# Patient Record
Sex: Female | Born: 1935 | Race: White | Hispanic: No | Smoking: Never smoker
Health system: Southern US, Community
[De-identification: ages and names within clinical notes are randomized; demographics above are authoritative.]

## PROBLEM LIST (undated history)

## (undated) DIAGNOSIS — B019 Varicella without complication: Secondary | ICD-10-CM

## (undated) DIAGNOSIS — I10 Essential (primary) hypertension: Secondary | ICD-10-CM

## (undated) DIAGNOSIS — M810 Age-related osteoporosis without current pathological fracture: Secondary | ICD-10-CM

## (undated) DIAGNOSIS — H269 Unspecified cataract: Secondary | ICD-10-CM

## (undated) DIAGNOSIS — C189 Malignant neoplasm of colon, unspecified: Secondary | ICD-10-CM

## (undated) DIAGNOSIS — C801 Malignant (primary) neoplasm, unspecified: Secondary | ICD-10-CM

## (undated) DIAGNOSIS — D649 Anemia, unspecified: Secondary | ICD-10-CM

## (undated) HISTORY — DX: Varicella without complication: B01.9

## (undated) HISTORY — PX: EYE SURGERY: SHX253

## (undated) HISTORY — DX: Essential (primary) hypertension: I10

## (undated) HISTORY — DX: Unspecified cataract: H26.9

## (undated) HISTORY — PX: COLONOSCOPY: SHX174

## (undated) HISTORY — DX: Malignant neoplasm of colon, unspecified: C18.9

## (undated) HISTORY — DX: Age-related osteoporosis without current pathological fracture: M81.0

## (undated) HISTORY — DX: Anemia, unspecified: D64.9

---

## 2007-12-26 ENCOUNTER — Encounter: Admission: RE | Admit: 2007-12-26 | Discharge: 2007-12-26 | Payer: Self-pay | Admitting: Family Medicine

## 2021-03-27 ENCOUNTER — Encounter: Payer: Self-pay | Admitting: Internal Medicine

## 2021-03-27 ENCOUNTER — Encounter (INDEPENDENT_AMBULATORY_CARE_PROVIDER_SITE_OTHER): Payer: Self-pay

## 2021-03-27 ENCOUNTER — Ambulatory Visit (INDEPENDENT_AMBULATORY_CARE_PROVIDER_SITE_OTHER): Payer: Medicare Other | Admitting: Internal Medicine

## 2021-03-27 ENCOUNTER — Other Ambulatory Visit: Payer: Self-pay

## 2021-03-27 VITALS — BP 118/72 | HR 78 | Temp 98.2°F | Resp 18 | Ht 63.5 in | Wt 127.4 lb

## 2021-03-27 DIAGNOSIS — E2839 Other primary ovarian failure: Secondary | ICD-10-CM

## 2021-03-27 DIAGNOSIS — M8580 Other specified disorders of bone density and structure, unspecified site: Secondary | ICD-10-CM | POA: Diagnosis not present

## 2021-03-27 DIAGNOSIS — R7301 Impaired fasting glucose: Secondary | ICD-10-CM

## 2021-03-27 DIAGNOSIS — Z0000 Encounter for general adult medical examination without abnormal findings: Secondary | ICD-10-CM | POA: Insufficient documentation

## 2021-03-27 DIAGNOSIS — Z833 Family history of diabetes mellitus: Secondary | ICD-10-CM | POA: Diagnosis not present

## 2021-03-27 DIAGNOSIS — M810 Age-related osteoporosis without current pathological fracture: Secondary | ICD-10-CM | POA: Insufficient documentation

## 2021-03-27 DIAGNOSIS — Z13220 Encounter for screening for lipoid disorders: Secondary | ICD-10-CM

## 2021-03-27 LAB — COMPREHENSIVE METABOLIC PANEL
ALT: 8 U/L (ref 0–35)
AST: 14 U/L (ref 0–37)
Albumin: 4 g/dL (ref 3.5–5.2)
Alkaline Phosphatase: 77 U/L (ref 39–117)
BUN: 14 mg/dL (ref 6–23)
CO2: 24 mEq/L (ref 19–32)
Calcium: 9.2 mg/dL (ref 8.4–10.5)
Chloride: 104 mEq/L (ref 96–112)
Creatinine, Ser: 1.01 mg/dL (ref 0.40–1.20)
GFR: 51.45 mL/min — ABNORMAL LOW (ref >60.00)
Glucose, Bld: 95 mg/dL (ref 70–99)
Potassium: 3.7 mEq/L (ref 3.5–5.1)
Sodium: 136 mEq/L (ref 135–145)
Total Bilirubin: 0.4 mg/dL (ref 0.2–1.2)
Total Protein: 7.1 g/dL (ref 6.0–8.3)

## 2021-03-27 LAB — CBC
HCT: 31.8 % — ABNORMAL LOW (ref 36.0–46.0)
Hemoglobin: 10.1 g/dL — ABNORMAL LOW (ref 12.0–15.0)
MCHC: 31.8 g/dL (ref 30.0–36.0)
MCV: 73.2 fl — ABNORMAL LOW (ref 78.0–100.0)
Platelets: 298 10*3/uL (ref 150.0–400.0)
RBC: 4.34 Mil/uL (ref 3.87–5.11)
RDW: 16.8 % — ABNORMAL HIGH (ref 11.5–15.5)
WBC: 7.7 10*3/uL (ref 4.0–10.5)

## 2021-03-27 LAB — LIPID PANEL
Cholesterol: 182 mg/dL (ref 0–200)
HDL: 61.6 mg/dL (ref >39.00)
LDL Cholesterol: 99 mg/dL (ref 0–99)
NonHDL: 120.01
Total CHOL/HDL Ratio: 3
Triglycerides: 107 mg/dL (ref 0.0–149.0)
VLDL: 21.4 mg/dL (ref 0.0–40.0)

## 2021-03-27 LAB — HEMOGLOBIN A1C: Hgb A1c MFr Bld: 5.7 % (ref 4.6–6.5)

## 2021-03-27 NOTE — Assessment & Plan Note (Signed)
Checking HgA1c for screening. Weight normal.

## 2021-03-27 NOTE — Progress Notes (Signed)
Subjective:   Patient ID: Ann [name], female    DOB: March 13, 1935, 86 y.o.   MRN: [id number]  HPI Here for establish care and medicare wellness, no new complaints. Please see A/P for status and treatment of chronic medical problems.   HPI #2: Overall healthy, family history diabetes (mother and sister with diabetes later in life, denies excessive thirst or urination, weight gain or loss) and osteopenia (review DEXA 2009 with osteopenia, no follow up since, denies fractures or family history hip fracture, does not exercise currently, taking calcium daily).   Diet: heart healthy Physical activity: sedentary Depression/mood screen: negative Hearing: intact to whispered voice, mild loss bilaterally Visual acuity: grossly normal, performs annual eye exam  ADLs: capable Fall risk: none Home safety: good Cognitive evaluation: intact to orientation, naming, recall and repetition EOL planning: adv directives discussed  [place] Visit from 03/27/2021 in [place]  at [facility] Total Score 0      I have personally reviewed and have noted 1. The patient's medical and social history - reviewed today no changes 2. Their use of alcohol, tobacco or illicit drugs 3. Their current medications and supplements 4. The patient's functional ability including ADL's, fall risks, home safety risks and hearing or visual impairment. 5. Diet and physical activities 6. Evidence for depression or mood disorders 7. Care team reviewed and updated 8.  The patient is not on an opioid pain medication.  Patient Care Team: Hoyt Koch, MD as PCP - General (Internal Medicine) [facility] Past Medical History:  Diagnosis Date  . Chicken pox    History reviewed. No pertinent surgical history. Family History  Problem Relation Age of Onset  . Cancer Mother   . Diabetes Mother   . Early death Mother   . Arthritis Father   . Cancer Sister   . Heart  disease Brother   . Heart attack Brother   . Diabetes Sister   . Stroke Sister    Review of Systems  Constitutional: Negative.   HENT: Negative.   Eyes: Negative.   Respiratory: Negative for cough, chest tightness and shortness of breath.   Cardiovascular: Negative for chest pain, palpitations and leg swelling.  Gastrointestinal: Negative for abdominal distention, abdominal pain, constipation, diarrhea, nausea and vomiting.  Musculoskeletal: Negative.   Skin: Negative.   Neurological: Negative.   Psychiatric/Behavioral: Negative.      Objective:  Physical Exam Constitutional:      Appearance: She is well-developed.  HENT:     Head: Normocephalic and atraumatic.  Cardiovascular:     Rate and Rhythm: Normal rate and regular rhythm.  Pulmonary:     Effort: Pulmonary effort is normal. No respiratory distress.     Breath sounds: Normal breath sounds. No wheezing or rales.  Abdominal:     General: Bowel sounds are normal. There is no distension.     Palpations: Abdomen is soft.     Tenderness: There is no abdominal tenderness. There is no rebound.  Musculoskeletal:     Cervical back: Normal range of motion.  Skin:    General: Skin is warm and dry.  Neurological:     Mental Status: She is alert and oriented to person, place, and time.     Coordination: Coordination normal.    EKG: Rate 64, axis normal, interval normal, sinus, no st or t wave changes except inverted t on AVR likely not significant, no prior to compare   Vitals:   03/27/21 0854  BP: 118/72  Pulse: 78  Resp: 18  Temp: 98.2 F (36.8 C)  TempSrc: Oral  SpO2: 100%  Weight: 127 lb 6.4 oz (57.8 kg)  Height: 5' 3.5" (1.613 m)   This visit occurred during the SARS-CoV-2 public health emergency.  Safety protocols were in place, including screening questions prior to the visit, additional usage of staff PPE, and extensive cleaning of exam room while observing appropriate contact time as indicated for  disinfecting solutions.   Assessment & Plan:

## 2021-03-27 NOTE — Patient Instructions (Addendum)
Your EKG is normal today. We are checking the labs and will call you back about the results.   Health Maintenance, Female Adopting a healthy lifestyle and getting preventive care are important in promoting health and wellness. Ask your health care provider about:  The right schedule for you to have regular tests and exams.  Things you can do on your own to prevent diseases and keep yourself healthy. What should I know about diet, weight, and exercise? Eat a healthy diet  Eat a diet that includes plenty of vegetables, fruits, low-fat dairy products, and lean protein.  Do not eat a lot of foods that are high in solid fats, added sugars, or sodium.   Maintain a healthy weight Body mass index (BMI) is used to identify weight problems. It estimates body fat based on height and weight. Your health care provider can help determine your BMI and help you achieve or maintain a healthy weight. Get regular exercise Get regular exercise. This is one of the most important things you can do for your health. Most adults should:  Exercise for at least 150 minutes each week. The exercise should increase your heart rate and make you sweat (moderate-intensity exercise).  Do strengthening exercises at least twice a week. This is in addition to the moderate-intensity exercise.  Spend less time sitting. Even light physical activity can be beneficial. Watch cholesterol and blood lipids Have your blood tested for lipids and cholesterol at 84 years of age, then have this test every 5 years. Have your cholesterol levels checked more often if:  Your lipid or cholesterol levels are high.  You are older than 84 years of age.  You are at high risk for heart disease. What should I know about cancer screening? Depending on your health history and family history, you may need to have cancer screening at various ages. This may include screening for:  Breast cancer.  Cervical cancer.  Colorectal cancer.  Skin  cancer.  Lung cancer. What should I know about heart disease, diabetes, and high blood pressure? Blood pressure and heart disease  High blood pressure causes heart disease and increases the risk of stroke. This is more likely to develop in people who have high blood pressure readings, are of African descent, or are overweight.  Have your blood pressure checked: ? Every 3-5 years if you are 33-65 years of age. ? Every year if you are 20 years old or older. Diabetes Have regular diabetes screenings. This checks your fasting blood sugar level. Have the screening done:  Once every three years after age 10 if you are at a normal weight and have a low risk for diabetes.  More often and at a younger age if you are overweight or have a high risk for diabetes. What should I know about preventing infection? Hepatitis B If you have a higher risk for hepatitis B, you should be screened for this virus. Talk with your health care provider to find out if you are at risk for hepatitis B infection. Hepatitis C Testing is recommended for:  Everyone born from [birth year] through 1963.  Anyone with known risk factors for hepatitis C. Sexually transmitted infections (STIs)  Get screened for STIs, including gonorrhea and chlamydia, if: ? You are sexually active and are younger than 84 years of age. ? You are older than 84 years of age and your health care provider tells you that you are at risk for this type of infection. ? Your sexual activity has changed  since you were last screened, and you are at increased risk for chlamydia or gonorrhea. Ask your health care provider if you are at risk.  Ask your health care provider about whether you are at high risk for HIV. Your health care provider may recommend a prescription medicine to help prevent HIV infection. If you choose to take medicine to prevent HIV, you should first get tested for HIV. You should then be tested every 3 months for as long as you are taking  the medicine. Pregnancy  If you are about to stop having your period (premenopausal) and you may become pregnant, seek counseling before you get pregnant.  Take 400 to 800 micrograms (mcg) of folic acid every day if you become pregnant.  Ask for birth control (contraception) if you want to prevent pregnancy. Osteoporosis and menopause Osteoporosis is a disease in which the bones lose minerals and strength with aging. This can result in bone fractures. If you are 53 years old or older, or if you are at risk for osteoporosis and fractures, ask your health care provider if you should:  Be screened for bone loss.  Take a calcium or vitamin D supplement to lower your risk of fractures.  Be given hormone replacement therapy (HRT) to treat symptoms of menopause. Follow these instructions at home: Lifestyle  Do not use any products that contain nicotine or tobacco, such as cigarettes, e-cigarettes, and chewing tobacco. If you need help quitting, ask your health care provider.  Do not use street drugs.  Do not share needles.  Ask your health care provider for help if you need support or information about quitting drugs. Alcohol use  Do not drink alcohol if: ? Your health care provider tells you not to drink. ? You are pregnant, may be pregnant, or are planning to become pregnant.  If you drink alcohol: ? Limit how much you use to 0-1 drink a day. ? Limit intake if you are breastfeeding.  Be aware of how much alcohol is in your drink. In the [place] one drink equals one 12 oz bottle of beer (355 mL), one 5 oz glass of wine (148 mL), or one 1 oz glass of hard liquor (44 mL). General instructions  Schedule regular health, dental, and eye exams.  Stay current with your vaccines.  Tell your health care provider if: ? You often feel depressed. ? You have ever been abused or do not feel safe at home. Summary  Adopting a healthy lifestyle and getting preventive care are important in  promoting health and wellness.  Follow your health care provider's instructions about healthy diet, exercising, and getting tested or screened for diseases.  Follow your health care provider's instructions on monitoring your cholesterol and blood pressure. This information is not intended to replace advice given to you by your health care provider. Make sure you discuss any questions you have with your health care provider. Document Revised: 11/29/2018 Document Reviewed: 11/29/2018 Elsevier Patient Education  2021 [employer].

## 2021-03-27 NOTE — Assessment & Plan Note (Signed)
Ordered DEXA to assess for progression. Last 2009.

## 2021-03-27 NOTE — Assessment & Plan Note (Signed)
Flu shot counseled yearly. Pneumonia she thinks up to date. Shingrix counseled get at pharmacy. Tetanus she is unsure if up to date. Colonoscopy has never had, last FOBT 2009, aged out screening. Mammogram aged out last 2009 no prior problems, pap smear aged out and dexa last 2009 due ordered today. Counseled about sun safety and mole surveillance. Counseled about the dangers of distracted driving. Given 10 year screening recommendations.

## 2021-06-16 ENCOUNTER — Emergency Department (HOSPITAL_BASED_OUTPATIENT_CLINIC_OR_DEPARTMENT_OTHER): Payer: Medicare Other

## 2021-06-16 ENCOUNTER — Emergency Department (HOSPITAL_BASED_OUTPATIENT_CLINIC_OR_DEPARTMENT_OTHER)
Admission: EM | Admit: 2021-06-16 | Discharge: 2021-06-16 | Disposition: A | Discharge status: Home / Self Care | Payer: Medicare Other | Attending: Emergency Medicine | Admitting: Emergency Medicine

## 2021-06-16 ENCOUNTER — Encounter (HOSPITAL_BASED_OUTPATIENT_CLINIC_OR_DEPARTMENT_OTHER): Payer: Self-pay | Admitting: Obstetrics and Gynecology

## 2021-06-16 ENCOUNTER — Other Ambulatory Visit: Payer: Self-pay

## 2021-06-16 DIAGNOSIS — W01198A Fall on same level from slipping, tripping and stumbling with subsequent striking against other object, initial encounter: Secondary | ICD-10-CM | POA: Insufficient documentation

## 2021-06-16 DIAGNOSIS — R296 Repeated falls: Secondary | ICD-10-CM | POA: Diagnosis not present

## 2021-06-16 DIAGNOSIS — S01511A Laceration without foreign body of lip, initial encounter: Secondary | ICD-10-CM | POA: Insufficient documentation

## 2021-06-16 DIAGNOSIS — S0033XA Contusion of nose, initial encounter: Secondary | ICD-10-CM | POA: Insufficient documentation

## 2021-06-16 DIAGNOSIS — S00501A Unspecified superficial injury of lip, initial encounter: Secondary | ICD-10-CM | POA: Diagnosis present

## 2021-06-16 DIAGNOSIS — W19XXXA Unspecified fall, initial encounter: Secondary | ICD-10-CM

## 2021-06-16 MED ORDER — LIDOCAINE HCL (PF) 1 % IJ SOLN
5.0000 mL | Freq: Once | INTRAMUSCULAR | Status: AC
Start: 2021-06-16 — End: 2021-06-16
  Administered 2021-06-16: 5 mL
  Filled 2021-06-16: qty 5

## 2021-06-16 NOTE — ED Triage Notes (Signed)
Patient reports to the ER for a mechanical fall and lip laceration. Patient's lip is bleeding inside her mouth and notable laceration outside. Patient states her left nostril has also been bleeding. Patient denies taking a blood thinner and denies LOC.

## 2021-06-16 NOTE — ED Provider Notes (Signed)
[facility] EMERGENCY DEPT Provider Note   CSN: [id number] Arrival date & time: 06/16/21  1239     History Chief Complaint  Patient presents with   Fall   Lip Laceration    Ann [name] is a 86 y.o. female.  The history is provided by the patient.  Laceration Location:  Mouth Mouth laceration location:  Lower inner lip and lower outer lip Length:  1 cm Laceration depth: through and through. Quality: jagged   Bleeding: controlled   Time since incident:  1 hour Laceration mechanism:  Fall (tripped over a hose at the gas pump) Pain details:    Quality:  Throbbing   Severity:  Mild   Timing:  Constant   Progression:  Unchanged Foreign body present:  No foreign bodies Relieved by:  Pressure Worsened by:  Nothing Ineffective treatments:  None tried Associated symptoms: numbness (upper lip only, not extremities)   Associated symptoms: no fever, no focal weakness and no rash       Past Medical History:  Diagnosis Date   Chicken pox     Patient Active Problem List   Diagnosis Date Noted   Routine general medical examination at a health care facility 03/27/2021   Family history of diabetes mellitus 03/27/2021   Osteopenia 03/27/2021    History reviewed. No pertinent surgical history.   OB History     Gravida      Para      Term      Preterm      AB      Living  2      SAB      IAB      Ectopic      Multiple      Live Births              Family History  Problem Relation Age of Onset   Cancer Mother    Diabetes Mother    Early death Mother    Arthritis Father    Cancer Sister    Heart disease Brother    Heart attack Brother    Diabetes Sister    Stroke Sister     Social History   Tobacco Use   Smoking status: Never   Smokeless tobacco: Never  Vaping Use   Vaping Use: Never used  Substance Use Topics   Alcohol use: Yes   Drug use: Never    Home Medications Prior to Admission medications   Medication Sig  Start Date End Date Taking? Authorizing Provider  B Complex Vitamins (B COMPLEX 1 PO) Take 1 tablet by mouth daily.   Yes [provider]  Biotin w/ Vitamins C & E (HAIR/SKIN/NAILS PO) Take 1 tablet by mouth daily.   Yes [provider]  calcium carbonate (OSCAL) 1500 (600 Ca) MG TABS tablet Take 600 mg of elemental calcium by mouth daily with breakfast.   Yes [provider]  ferrous sulfate 325 (65 FE) MG tablet Take 325 mg by mouth daily with breakfast.   Yes [provider]  magnesium 30 MG tablet Take 30 mg by mouth 2 (two) times daily.   Yes [provider]    Allergies    Patient has no known allergies.  Review of Systems   Review of Systems  Constitutional:  Negative for chills and fever.  HENT:  Negative for ear pain and sore throat.   Eyes:  Negative for pain and visual disturbance.  Respiratory:  Negative for cough and shortness  of breath.   Cardiovascular:  Negative for chest pain and palpitations.  Gastrointestinal:  Negative for abdominal pain and vomiting.  Genitourinary:  Negative for dysuria and hematuria.  Musculoskeletal:  Negative for arthralgias and back pain.  Skin:  Negative for color change and rash.  Neurological:  Negative for focal weakness, seizures and syncope.  All other systems reviewed and are negative.  Physical Exam Updated Vital Signs BP (!) 157/91 (BP Location: Left Arm)   Pulse (!) 102   Temp 98.3 F (36.8 C) (Oral)   Resp 16   Ht 5' 3.5" (1.613 m)   Wt 57.6 kg   SpO2 98%   BMI 22.14 kg/m   Physical Exam Vitals and nursing note reviewed.  HENT:     Head: Normocephalic and atraumatic.     Nose: Nose normal.     Mouth/Throat:     Comments: There is a through and through laceration of the lower lip at its midpoint.  It is jagged and appears consistent with a bite.  No dental trauma Eyes:     General: No scleral icterus. Pulmonary:     Effort: Pulmonary effort is normal. No respiratory  distress.  Musculoskeletal:     Cervical back: Normal range of motion. No tenderness.  Skin:    General: Skin is warm and dry.  Neurological:     General: No focal deficit present.     Mental Status: She is alert. Mental status is at baseline.     Motor: No weakness.     Gait: Gait normal.  Psychiatric:        Mood and Affect: Mood normal.    ED Results / Procedures / Treatments   Labs (all labs ordered are listed, but only abnormal results are displayed) Labs Reviewed - No data to display  EKG None  Radiology CT Head Wo Contrast  Result Date: 06/16/2021 CLINICAL DATA:  Recent fall EXAM: CT HEAD WITHOUT CONTRAST TECHNIQUE: Contiguous axial images were obtained from the base of the skull through the vertex without intravenous contrast. COMPARISON:  None. FINDINGS: Brain: No evidence of acute infarction, hemorrhage, hydrocephalus, extra-axial collection or mass lesion/mass effect. Chronic atrophic and ischemic changes are noted. Vascular: No hyperdense vessel or unexpected calcification. Skull: Normal. Negative for fracture or focal lesion. Sinuses/Orbits: No acute finding. Other: Some air is noted along the nasal bridge likely related to the recent injury. The nasal bones are incompletely evaluated on this exam. IMPRESSION: Chronic atrophic and ischemic changes. No acute intracranial abnormality noted. Subcutaneous air along the nasal bridge which may be related to the recent injury. The need for further evaluation can be determined on a clinical basis. Electronically Signed   By: Inez Catalina M.D.   On: 06/16/2021 14:47    Procedures .Marland KitchenLaceration Repair  Date/Time: 06/16/2021 3:50 PM Performed by: Arnaldo Natal, MD Authorized by: Arnaldo Natal, MD   Consent:    Consent obtained:  Verbal   Consent given by:  Patient   Risks, benefits, and alternatives were discussed: yes     Risks discussed:  Infection, pain, retained foreign body, need for additional repair, poor cosmetic  result, poor wound healing and vascular damage   Alternatives discussed:  No treatment, delayed treatment, observation and referral Universal protocol:    Immediately prior to procedure, a time out was called: yes     Patient identity confirmed:  Verbally with patient Anesthesia:    Anesthesia method:  Local infiltration   Local anesthetic:  Lidocaine 1%  w/o epi Laceration details:    Location:  Lip   Lip location:  Lower interior lip   Length (cm):  1   Depth (mm):  1 Pre-procedure details:    Preparation:  Patient was prepped and draped in usual sterile fashion Exploration:    Hemostasis achieved with:  Direct pressure   Wound exploration: wound explored through full range of motion and entire depth of wound visualized     Wound extent: no fascia violation noted, no foreign bodies/material noted, no muscle damage noted, no nerve damage noted and no vascular damage noted     Contaminated: no   Treatment:    Area cleansed with:  Saline   Amount of cleaning:  Extensive   Irrigation solution:  Sterile saline   Irrigation volume:  10   Irrigation method:  Syringe   Visualized foreign bodies/material removed: no     Debridement:  None Skin repair:    Repair method:  Sutures   Suture size:  4-0   Suture material:  Fast-absorbing gut   Suture technique:  Horizontal mattress   Number of sutures:  2 Approximation:    Approximation:  Close   Vermilion border well-aligned: yes   Repair type:    Repair type:  Simple Post-procedure details:    Dressing:  Open (no dressing)   Procedure completion:  Tolerated well, no immediate complications   Medications Ordered in ED Medications  lidocaine (PF) (XYLOCAINE) 1 % injection 5 mL (has no administration in time range)    ED Course  I have reviewed the triage vital signs and the nursing notes.  Pertinent labs & imaging results that were available during my care of the patient were reviewed by me and considered in my medical decision  making (see chart for details).  Clinical Course as of 06/16/21 1549  Tue Jun 16, 2021  1548 Patient declined tetanus injection.  She states that it is not up to date. [AW]    Clinical Course User Index [AW] Arnaldo Natal, MD   MDM Rules/Calculators/A&P                          [name] presents with fall and a lip laceration. CT performed and was negative for head bleed or other trauma. Laceration was through and through.  I placed absorbable sutures with buried knots in the internal laceration to avoid food contamination.  The external laceration was more stellate and jagged.  It was macerated and would not of come together well with a suture.  It did not cross the vermilion border. Final Clinical Impression(s) / ED Diagnoses Final diagnoses:  Fall, initial encounter  Lip laceration, initial encounter  Contusion of nose, initial encounter    Rx / DC Orders ED Discharge Orders     None        Arnaldo Natal, MD 06/16/21 1553

## 2021-06-16 NOTE — Discharge Instructions (Addendum)
Please rinse your mouth twice daily with a one-to-one mixture of water and hydrogen peroxide.  Apply ice to the bridge of your nose as well as your lip.  Stitches should dissolve.

## 2021-07-07 ENCOUNTER — Ambulatory Visit (INDEPENDENT_AMBULATORY_CARE_PROVIDER_SITE_OTHER): Payer: Medicare Other | Admitting: Ophthalmology

## 2021-07-07 ENCOUNTER — Other Ambulatory Visit: Payer: Self-pay

## 2021-07-07 DIAGNOSIS — H35341 Macular cyst, hole, or pseudohole, right eye: Secondary | ICD-10-CM | POA: Insufficient documentation

## 2021-07-07 DIAGNOSIS — Z9889 Other specified postprocedural states: Secondary | ICD-10-CM | POA: Insufficient documentation

## 2021-07-07 NOTE — Assessment & Plan Note (Signed)
OS looks great excellent acuity recovery

## 2021-07-07 NOTE — Assessment & Plan Note (Addendum)
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  Patient will need repair via vitrectomy, membrane peel and injection of intravitreal gas.  I will explained to the patient that we no longer do extensive facedown positioning but only look downwards as of a book is reading in a lap for the 3 days postoperatively  Patient will be restricted not to sleep on her back for 2 weeks after surgery.  We will schedule vitrectomy membrane peel to fit her schedule sometime within the next 2 months  The nature of macular hole was discussed with the patient as well as possibility of second eye involvement at a later date.  The risks of no treatment were reviewed as well, as the possibility of surgical repair and the risks with that modality.  The requirement for postoperative face downward in a reading position was discussed.  The usual period of 3-5 days, positioning while awake, was disclosed.  The patient must not sleep on back.  The patient may sleep on either side after surgery  for approximately  2 weeks.  The alternative surgical repair with vitrectomy and using silicone oil was discussed.   Typical results with oil for this use to repair macular hole are not as good as gas utilization.   With oil use, the need for a second surgery reviewed.   If the patient's operative eye is [place], cataract surgery is very often recommended by referring to cataract surgeon, prior to Vitrectomy. This will allow a clear view of the macular hole  for the repair,  and best visual acuity results ultimately for the patient.  All the patient's questions were answered.  An informational note was given.

## 2021-07-07 NOTE — Progress Notes (Signed)
07/07/2021     CHIEF COMPLAINT Patient presents for Macular Hole   HISTORY OF PRESENT ILLNESS: Ann [name] is a 86 y.o. female who presents to the clinic today for:     Referring physician: [facility] [address] [place],  [place] [address]  HISTORICAL INFORMATION:   Selected notes from the MEDICAL RECORD NUMBER    Lab Results  Component Value Date   HGBA1C 5.7 03/27/2021     CURRENT MEDICATIONS: No current outpatient medications on file. (Ophthalmic Drugs)   No current facility-administered medications for this visit. (Ophthalmic Drugs)   Current Outpatient Medications (Other)  Medication Sig   B Complex Vitamins (B COMPLEX 1 PO) Take 1 tablet by mouth daily.   Biotin w/ Vitamins C & E (HAIR/SKIN/NAILS PO) Take 1 tablet by mouth daily.   calcium carbonate (OSCAL) 1500 (600 Ca) MG TABS tablet Take 600 mg of elemental calcium by mouth daily with breakfast.   ferrous sulfate 325 (65 FE) MG tablet Take 325 mg by mouth daily with breakfast.   magnesium 30 MG tablet Take 30 mg by mouth 2 (two) times daily.   No current facility-administered medications for this visit. (Other)      REVIEW OF SYSTEMS:    ALLERGIES No Known Allergies  PAST MEDICAL HISTORY Past Medical History:  Diagnosis Date   Chicken pox    No past surgical history on file.  FAMILY HISTORY Family History  Problem Relation Age of Onset   Cancer Mother    Diabetes Mother    Early death Mother    Arthritis Father    Cancer Sister    Heart disease Brother    Heart attack Brother    Diabetes Sister    Stroke Sister     SOCIAL HISTORY Social History   Tobacco Use   Smoking status: Never   Smokeless tobacco: Never  Vaping Use   Vaping Use: Never used  Substance Use Topics   Alcohol use: Yes   Drug use: Never         OPHTHALMIC EXAM:  Base Eye Exam     Visual Acuity (ETDRS)       Right Left   Dist cc 20/60 -1 20/30 +2    Correction: Glasses          Tonometry     Tonopen, 2:00 PM         Pupils       Pupils APD   Right PERRL None   Left PERRL None         Visual Fields       Left Right    Full Full         Extraocular Movement       Right Left    Full, Ortho Full, Ortho         Neuro/Psych     Oriented x3: Yes   Mood/Affect: Normal         Dilation     Both eyes: 1.0% Mydriacyl, 2.5% Phenylephrine @ 2:00 PM           Slit Lamp and Fundus Exam     External Exam       Right Left   External Normal Normal         Slit Lamp Exam       Right Left   Lids/Lashes Normal Normal   Conjunctiva/Sclera White and quiet White and quiet   Cornea Clear Clear  Anterior Chamber Deep and quiet Deep and quiet   Iris Round and reactive Round and reactive   Lens Centered posterior chamber intraocular lens Centered posterior chamber intraocular lens   Anterior Vitreous Normal Clear avitric         Fundus Exam       Right Left   Posterior Vitreous Normal Clear avitric   Disc Normal Normal   C/D Ratio 0.25 0.25   Macula Normal Normal   Vessels Normal Normal   Periphery Normal, no holes or tears Normal            IMAGING AND PROCEDURES  Imaging and Procedures for 07/07/21  OCT, Retina - OU - Both Eyes       Right Eye Quality was good. Scan locations included subfoveal. Central Foveal Thickness: 315. Progression has worsened. Findings include macular hole, cystoid macular edema.   Left Eye Quality was good. Scan locations included subfoveal. Central Foveal Thickness: 312. Progression has been stable.   Notes OS with a history of macular hole with VMT, repair 2016.  Now with intact outer retina.  Stable  OD, with macular hole approximately 250 m in size with adjacent perifoveal CME             ASSESSMENT/PLAN:  History of vitrectomy OS looks great excellent acuity recovery  Macular hole of right eye New vision loss right eye associated with macular hole.   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  Patient will need repair via vitrectomy, membrane peel and injection of intravitreal gas.  I will explained to the patient that we no longer do extensive facedown positioning but only look downwards as of a book is reading in a lap for the 3 days postoperatively  Patient will be restricted not to sleep on her back for 2 weeks after surgery.  We will schedule vitrectomy membrane peel to fit her schedule sometime within the next 2 months  The nature of macular hole was discussed with the patient as well as possibility of second eye involvement at a later date.  The risks of no treatment were reviewed as well, as the possibility of surgical repair and the risks with that modality.  The requirement for postoperative face downward in a reading position was discussed.  The usual period of 3-5 days, positioning while awake, was disclosed.  The patient must not sleep on back.  The patient may sleep on either side after surgery  for approximately  2 weeks.  The alternative surgical repair with vitrectomy and using silicone oil was discussed.   Typical results with oil for this use to repair macular hole are not as good as gas utilization.   With oil use, the need for a second surgery reviewed.   If the patient's operative eye is [place], cataract surgery is very often recommended by referring to cataract surgeon, prior to Vitrectomy. This will allow a clear view of the macular hole  for the repair,  and best visual acuity results ultimately for the patient.  All the patient's questions were answered.  An informational note was given.     ICD-10-CM   1. Macular hole of right eye  H35.341 OCT, Retina - OU - Both Eyes    2. History of vitrectomy  Z98.890       1.  OD, with new onset macular hole with vision loss.  Will need and require surgical intervention via vitrectomy membrane peel, injection intravitreal gas and face looking downwards gently for 3  days while  awake postoperatively.  2.  Patient understands she is not to sleep or rest on her back for 2 weeks post surgery.  3.  Surgical date will be provided to the patient today  4.  Patient return for preoperative paperwork and discussion regarding the use of topical medications preoperatively  Ophthalmic Meds Ordered this visit:  No orders of the defined types were placed in this encounter.      Return ,, [facility], for Schedule vitrectomy membrane [phone] gas injection, OD.  There are no Patient Instructions on file for this visit.   Explained the diagnoses, plan, and follow up with the patient and they expressed understanding.  Patient expressed understanding of the importance of proper follow up care.   Clent Demark Kimberla Driskill M.D. Diseases & Surgery of the Retina and Vitreous Retina & Diabetic [place] 07/07/21     Abbreviations: M myopia (nearsighted); A astigmatism; H hyperopia (farsighted); P presbyopia; Mrx spectacle prescription;  CTL contact lenses; OD right eye; OS left eye; OU both eyes  XT exotropia; ET esotropia; PEK punctate epithelial keratitis; PEE punctate epithelial erosions; DES dry eye syndrome; MGD meibomian gland dysfunction; ATs artificial tears; PFAT's preservative free artificial tears; [place] nuclear sclerotic cataract; PSC posterior subcapsular cataract; ERM epi-retinal membrane; PVD posterior vitreous detachment; RD retinal detachment; DM diabetes mellitus; DR diabetic retinopathy; NPDR non-proliferative diabetic retinopathy; PDR proliferative diabetic retinopathy; CSME clinically significant macular edema; DME diabetic macular edema; dbh dot blot hemorrhages; CWS cotton wool spot; POAG primary open angle glaucoma; C/D cup-to-disc ratio; HVF humphrey visual field; GVF goldmann visual field; OCT optical coherence tomography; IOP intraocular pressure; BRVO Branch retinal vein occlusion; CRVO central retinal vein occlusion; CRAO central retinal artery  occlusion; BRAO branch retinal artery occlusion; RT retinal tear; SB scleral buckle; PPV pars plana vitrectomy; VH Vitreous hemorrhage; PRP panretinal laser photocoagulation; IVK intravitreal kenalog; VMT vitreomacular traction; MH Macular hole;  NVD neovascularization of the disc; NVE neovascularization elsewhere; AREDS age related eye disease study; ARMD age related macular degeneration; POAG primary open angle glaucoma; EBMD epithelial/anterior basement membrane dystrophy; ACIOL anterior chamber intraocular lens; IOL intraocular lens; PCIOL posterior chamber intraocular lens; Phaco/IOL phacoemulsification with intraocular lens placement; [place] photorefractive keratectomy; LASIK laser assisted in situ keratomileusis; HTN hypertension; DM diabetes mellitus; COPD chronic obstructive pulmonary disease

## 2021-07-13 ENCOUNTER — Ambulatory Visit (INDEPENDENT_AMBULATORY_CARE_PROVIDER_SITE_OTHER): Payer: Medicare Other | Admitting: Ophthalmology

## 2021-07-13 ENCOUNTER — Other Ambulatory Visit: Payer: Self-pay

## 2021-07-13 ENCOUNTER — Encounter (INDEPENDENT_AMBULATORY_CARE_PROVIDER_SITE_OTHER): Payer: Self-pay | Admitting: Ophthalmology

## 2021-07-13 DIAGNOSIS — H35341 Macular cyst, hole, or pseudohole, right eye: Secondary | ICD-10-CM

## 2021-07-13 MED ORDER — OFLOXACIN 0.3 % OP SOLN: 1.0000 [drp] | Freq: Four times a day (QID) | OPHTHALMIC | 0 refills | Status: AC

## 2021-07-13 MED ORDER — PREDNISOLONE ACETATE 1 % OP SUSP: 1.0000 [drp] | Freq: Four times a day (QID) | OPHTHALMIC | 0 refills | Status: AC

## 2021-07-13 NOTE — Progress Notes (Signed)
07/13/2021     CHIEF COMPLAINT Patient presents for Pre-op Exam (Pt here for pre-op. Pt will have PPV/Membrane Peel and Gas Injection OD on 07/22/2021/)   HISTORY OF PRESENT ILLNESS: Ann [name] is a 86 y.o. female who presents to the clinic today for:   HPI     Pre-op Exam           Comments: Pt here for pre-op. Pt will have PPV/Membrane Peel and Gas Injection OD on 07/22/2021        Last edited by Kendra Opitz, COA on 07/13/2021 10:43 AM.        HISTORICAL INFORMATION:   Selected notes from the MEDICAL RECORD NUMBER    Lab Results  Component Value Date   HGBA1C 5.7 03/27/2021     CURRENT MEDICATIONS: Current Outpatient Medications (Ophthalmic Drugs)  Medication Sig   ofloxacin (OCUFLOX) 0.3 % ophthalmic solution Place 1 drop into the right eye in the morning, at noon, in the evening, and at bedtime for 21 doses.   prednisoLONE acetate (PRED FORTE) 1 % ophthalmic suspension Place 1 drop into the right eye 4 (four) times daily for 21 doses.   No current facility-administered medications for this visit. (Ophthalmic Drugs)   Current Outpatient Medications (Other)  Medication Sig   B Complex Vitamins (B COMPLEX 1 PO) Take 1 tablet by mouth daily.   Biotin w/ Vitamins C & E (HAIR/SKIN/NAILS PO) Take 1 tablet by mouth daily.   calcium carbonate (OSCAL) 1500 (600 Ca) MG TABS tablet Take 600 mg of elemental calcium by mouth daily with breakfast.   ferrous sulfate 325 (65 FE) MG tablet Take 325 mg by mouth daily with breakfast.   magnesium 30 MG tablet Take 30 mg by mouth 2 (two) times daily.   No current facility-administered medications for this visit. (Other)     ALLERGIES No Known Allergies  PAST MEDICAL HISTORY Past Medical History:  Diagnosis Date   Chicken pox    History reviewed. No pertinent surgical history.  FAMILY HISTORY Family History  Problem Relation Age of Onset   Cancer Mother    Diabetes Mother    Early death Mother    Arthritis  Father    Cancer Sister    Heart disease Brother    Heart attack Brother    Diabetes Sister    Stroke Sister     SOCIAL HISTORY Social History   Tobacco Use   Smoking status: Never   Smokeless tobacco: Never  Vaping Use   Vaping Use: Never used  Substance Use Topics   Alcohol use: Yes   Drug use: Never         OPHTHALMIC EXAM:  Base Eye Exam     Visual Acuity (ETDRS)       Right Left   Dist cc 20/60 -1 20/25 -1   Dist ph cc NI          Tonometry (Tonopen, 10:49 AM)       Right Left   Pressure 12 12         Pupils       Pupils Dark Light Shape React APD   Right PERRL 3 2 Round Brisk None   Left PERRL 3 2 Round Brisk None         Extraocular Movement       Right Left    Full Full         Neuro/Psych     Oriented x3: Yes  Mood/Affect: Normal         Dilation     Both eyes: No Dilation @ 10:49 AM           Slit Lamp and Fundus Exam     External Exam       Right Left   External Normal Normal         Slit Lamp Exam       Right Left   Lids/Lashes Normal Normal   Conjunctiva/Sclera White and quiet White and quiet   Cornea Clear Clear   Anterior Chamber Deep and quiet Deep and quiet   Iris Round and reactive Round and reactive   Lens Centered posterior chamber intraocular lens Centered posterior chamber intraocular lens   Anterior Vitreous Normal Clear avitric         Fundus Exam       Right Left   Posterior Vitreous Normal Clear avitric   Disc Normal Normal   C/D Ratio 0.25 0.25   Macula Normal Normal   Vessels Normal Normal   Periphery Normal, no holes or tears Normal            IMAGING AND PROCEDURES  Imaging and Procedures for '@TODAY'$ @           ASSESSMENT/PLAN:  No diagnosis found.  Ophthalmic Meds Ordered this visit:  Meds ordered this encounter  Medications   ofloxacin (OCUFLOX) 0.3 % ophthalmic solution    Sig: Place 1 drop into the right eye in the morning, at noon, in the evening,  and at bedtime for 21 doses.    Dispense:  5 mL    Refill:  0   prednisoLONE acetate (PRED FORTE) 1 % ophthalmic suspension    Sig: Place 1 drop into the right eye 4 (four) times daily for 21 doses.    Dispense:  5 mL    Refill:  0        Pre-op completed. Operative consent obtained with pre-op eye drops reviewed with [name] and sent via [facility] as needed. Post op instructions reviewed with patient and per patient all questions answered.  [place], COA

## 2021-07-22 ENCOUNTER — Encounter (AMBULATORY_SURGERY_CENTER): Payer: Medicare Other | Admitting: Ophthalmology

## 2021-07-22 DIAGNOSIS — H35341 Macular cyst, hole, or pseudohole, right eye: Secondary | ICD-10-CM

## 2021-07-23 ENCOUNTER — Ambulatory Visit (INDEPENDENT_AMBULATORY_CARE_PROVIDER_SITE_OTHER): Payer: Medicare Other | Admitting: Ophthalmology

## 2021-07-23 ENCOUNTER — Encounter (INDEPENDENT_AMBULATORY_CARE_PROVIDER_SITE_OTHER): Payer: Self-pay | Admitting: Ophthalmology

## 2021-07-23 ENCOUNTER — Other Ambulatory Visit: Payer: Self-pay

## 2021-07-23 DIAGNOSIS — H35341 Macular cyst, hole, or pseudohole, right eye: Secondary | ICD-10-CM

## 2021-07-23 NOTE — Progress Notes (Signed)
07/23/2021     CHIEF COMPLAINT Patient presents for Post-op Follow-up (1 day post op od sx on 07/22/2021/Pt states, "My eye was a little scratchy and felt like it had something in it but not too bad."/)   HISTORY OF PRESENT ILLNESS: Ann [name] is a 86 y.o. female who presents to the clinic today for:   HPI     Post-op Follow-up           Laterality: right eye   Discomfort: foreign body sensation.  Negative for pain and floaters   Comments: 1 day post op od sx on 07/22/2021 Pt states, "My eye was a little scratchy and felt like it had something in it but not too bad."        Last edited by Kendra Opitz, COA on 07/23/2021  9:10 AM.      Referring physician: Hoyt Koch, MD [place],  [place] [address]  HISTORICAL INFORMATION:   Selected notes from the MEDICAL RECORD NUMBER    Lab Results  Component Value Date   HGBA1C 5.7 03/27/2021     CURRENT MEDICATIONS: No current outpatient medications on file. (Ophthalmic Drugs)   No current facility-administered medications for this visit. (Ophthalmic Drugs)   Current Outpatient Medications (Other)  Medication Sig   B Complex Vitamins (B COMPLEX 1 PO) Take 1 tablet by mouth daily.   Biotin w/ Vitamins C & E (HAIR/SKIN/NAILS PO) Take 1 tablet by mouth daily.   calcium carbonate (OSCAL) 1500 (600 Ca) MG TABS tablet Take 600 mg of elemental calcium by mouth daily with breakfast.   ferrous sulfate 325 (65 FE) MG tablet Take 325 mg by mouth daily with breakfast.   magnesium 30 MG tablet Take 30 mg by mouth 2 (two) times daily.   No current facility-administered medications for this visit. (Other)      REVIEW OF SYSTEMS:    ALLERGIES No Known Allergies  PAST MEDICAL HISTORY Past Medical History:  Diagnosis Date   Chicken pox    History reviewed. No pertinent surgical history.  FAMILY HISTORY Family History  Problem Relation Age of Onset   Cancer Mother    Diabetes Mother    Early  death Mother    Arthritis Father    Cancer Sister    Heart disease Brother    Heart attack Brother    Diabetes Sister    Stroke Sister     SOCIAL HISTORY Social History   Tobacco Use   Smoking status: Never   Smokeless tobacco: Never  Vaping Use   Vaping Use: Never used  Substance Use Topics   Alcohol use: Yes   Drug use: Never         OPHTHALMIC EXAM:  Base Eye Exam     Visual Acuity (ETDRS)       Right Left   Dist [place] HM 20/25 -2         Tonometry (Tonopen, 9:15 AM)       Right Left   Pressure 18 14         Pupils       Dark Light Shape React   Right 7 7 Round Dilated   Left             Neuro/Psych     Oriented x3: Yes   Mood/Affect: Normal         Dilation     Right eye: 1.0% Mydriacyl, 2.5% Phenylephrine @ 9:14 AM  Slit Lamp and Fundus Exam     External Exam       Right Left   External Normal Normal         Slit Lamp Exam       Right Left   Lids/Lashes Normal Normal   Conjunctiva/Sclera White and quiet White and quiet   Cornea Clear Clear   Anterior Chamber Deep and quiet Deep and quiet   Iris Round and reactive Round and reactive   Lens Centered posterior chamber intraocular lens Centered posterior chamber intraocular lens   Anterior Vitreous Normal Clear avitric         Fundus Exam       Right Left   Posterior Vitreous clear , avitric    Disc Normal    C/D Ratio 0.25    Macula 20d, hole appears closed    Vessels Normal    Periphery Normal, no holes or tears             IMAGING AND PROCEDURES  Imaging and Procedures for 07/23/21           ASSESSMENT/PLAN:  Macular hole of right eye Postop day #1 Vitrectomy membrane peel, injection SF 6 gas 07-22-2021, looks great hole appears closed yet incomplete details through the gas bubble       ICD-10-CM   1. Macular hole of right eye  H35.341       1.  Postop day #1 vitrectomy membrane peel with gas injection, looks great.  2.   Listening with reading position for the next 3 days while awake discussed with the patient.  Sleep on either side for the next 2 weeks.  3.  Patient instructed not to sleep or travel to places a high elevation for the next 2 weeks.  Ophthalmic Meds Ordered this visit:  No orders of the defined types were placed in this encounter.      Return for dilate, POST OP, OCT, OD.  Patient Instructions  Ofloxacin  4 times daily to the operative eye  Prednisolone acetate 1 drop to the operative eye 4 times daily  Patient instructed not to refill the medications and use them for maximum of 3 weeks.  Patient instructed do not rub the eye.  Patient has the option to use the patch at night.   Macular hole surgery was explained with the need for a gas injection. The patient is aware of proper face down position (like looking downward naturally while  reading a book in a lap) for 3-5 days. Patient was advised to not lay on their back while sleeping or resting, usually for 2 weeks. Do not travel to places of elevation while gas bubble is in the eye, usually for 2 weeks    Explained the diagnoses, plan, and follow up with the patient and they expressed understanding.  Patient expressed understanding of the importance of proper follow up care.   Clent Demark Angelyne Terwilliger M.D. Diseases & Surgery of the Retina and Vitreous Retina & Diabetic [place] 07/23/21     Abbreviations: M myopia (nearsighted); A astigmatism; H hyperopia (farsighted); P presbyopia; Mrx spectacle prescription;  CTL contact lenses; OD right eye; OS left eye; OU both eyes  XT exotropia; ET esotropia; PEK punctate epithelial keratitis; PEE punctate epithelial erosions; DES dry eye syndrome; MGD meibomian gland dysfunction; ATs artificial tears; PFAT's preservative free artificial tears; [place] nuclear sclerotic cataract; PSC posterior subcapsular cataract; ERM epi-retinal membrane; PVD posterior vitreous detachment; RD retinal detachment; DM  diabetes mellitus; DR  diabetic retinopathy; NPDR non-proliferative diabetic retinopathy; PDR proliferative diabetic retinopathy; CSME clinically significant macular edema; DME diabetic macular edema; dbh dot blot hemorrhages; CWS cotton wool spot; POAG primary open angle glaucoma; C/D cup-to-disc ratio; HVF humphrey visual field; GVF goldmann visual field; OCT optical coherence tomography; IOP intraocular pressure; BRVO Branch retinal vein occlusion; CRVO central retinal vein occlusion; CRAO central retinal artery occlusion; BRAO branch retinal artery occlusion; RT retinal tear; SB scleral buckle; PPV pars plana vitrectomy; VH Vitreous hemorrhage; PRP panretinal laser photocoagulation; IVK intravitreal kenalog; VMT vitreomacular traction; MH Macular hole;  NVD neovascularization of the disc; NVE neovascularization elsewhere; AREDS age related eye disease study; ARMD age related macular degeneration; POAG primary open angle glaucoma; EBMD epithelial/anterior basement membrane dystrophy; ACIOL anterior chamber intraocular lens; IOL intraocular lens; PCIOL posterior chamber intraocular lens; Phaco/IOL phacoemulsification with intraocular lens placement; [place] photorefractive keratectomy; LASIK laser assisted in situ keratomileusis; HTN hypertension; DM diabetes mellitus; COPD chronic obstructive pulmonary disease

## 2021-07-23 NOTE — Patient Instructions (Signed)
Ofloxacin  4 times daily to the operative eye  Prednisolone acetate 1 drop to the operative eye 4 times daily  Patient instructed not to refill the medications and use them for maximum of 3 weeks.  Patient instructed do not rub the eye.  Patient has the option to use the patch at night.   Macular hole surgery was explained with the need for a gas injection. The patient is aware of proper face down position (like looking downward naturally while  reading a book in a lap) for 3-5 days. Patient was advised to not lay on their back while sleeping or resting, usually for 2 weeks. Do not travel to places of elevation while gas bubble is in the eye, usually for 2 weeks

## 2021-07-23 NOTE — Assessment & Plan Note (Signed)
Postop day #1 Vitrectomy membrane peel, injection SF 6 gas 07-22-2021, looks great hole appears closed yet incomplete details through the gas bubble

## 2021-07-29 ENCOUNTER — Encounter (INDEPENDENT_AMBULATORY_CARE_PROVIDER_SITE_OTHER): Payer: Self-pay | Admitting: Ophthalmology

## 2021-07-29 ENCOUNTER — Other Ambulatory Visit: Payer: Self-pay

## 2021-07-29 ENCOUNTER — Ambulatory Visit (INDEPENDENT_AMBULATORY_CARE_PROVIDER_SITE_OTHER): Payer: Medicare Other | Admitting: Ophthalmology

## 2021-07-29 DIAGNOSIS — H35341 Macular cyst, hole, or pseudohole, right eye: Secondary | ICD-10-CM

## 2021-07-29 NOTE — Progress Notes (Signed)
07/29/2021     CHIEF COMPLAINT Patient presents for Post-op Follow-up (1 week post op OD and OCT - Sx 07/22/2021/Pt states, "I can tell that my bubble is getting smaller. My vision seems to be much better. I am not having any issues at all that I can tell."/Pt reports using Ofloxacin and Pred QID OD/)   HISTORY OF PRESENT ILLNESS: Ann [name] is a 86 y.o. female who presents to the clinic today for:   HPI     Post-op Follow-up           Laterality: right eye   Discomfort: Negative for pain, itching and foreign body sensation   Vision: is stable   Comments: 1 week post op OD and OCT - Sx 07/22/2021 Pt states, "I can tell that my bubble is getting smaller. My vision seems to be much better. I am not having any issues at all that I can tell." Pt reports using Ofloxacin and Pred QID OD        Last edited by Kendra Opitz, COA on 07/29/2021  9:27 AM.      Referring physician: Hoyt Koch, MD [place],  [place] [address]  HISTORICAL INFORMATION:   Selected notes from the MEDICAL RECORD NUMBER    Lab Results  Component Value Date   HGBA1C 5.7 03/27/2021     CURRENT MEDICATIONS: No current outpatient medications on file. (Ophthalmic Drugs)   No current facility-administered medications for this visit. (Ophthalmic Drugs)   Current Outpatient Medications (Other)  Medication Sig   B Complex Vitamins (B COMPLEX 1 PO) Take 1 tablet by mouth daily.   Biotin w/ Vitamins C & E (HAIR/SKIN/NAILS PO) Take 1 tablet by mouth daily.   calcium carbonate (OSCAL) 1500 (600 Ca) MG TABS tablet Take 600 mg of elemental calcium by mouth daily with breakfast.   ferrous sulfate 325 (65 FE) MG tablet Take 325 mg by mouth daily with breakfast.   magnesium 30 MG tablet Take 30 mg by mouth 2 (two) times daily.   No current facility-administered medications for this visit. (Other)      REVIEW OF SYSTEMS:    ALLERGIES No Known Allergies  PAST MEDICAL  HISTORY Past Medical History:  Diagnosis Date   Chicken pox    History reviewed. No pertinent surgical history.  FAMILY HISTORY Family History  Problem Relation Age of Onset   Cancer Mother    Diabetes Mother    Early death Mother    Arthritis Father    Cancer Sister    Heart disease Brother    Heart attack Brother    Diabetes Sister    Stroke Sister     SOCIAL HISTORY Social History   Tobacco Use   Smoking status: Never   Smokeless tobacco: Never  Vaping Use   Vaping Use: Never used  Substance Use Topics   Alcohol use: Yes   Drug use: Never         OPHTHALMIC EXAM:  Base Eye Exam     Visual Acuity (ETDRS)       Right Left   Dist [place] 20/50 20/25 -1   Dist ph [place] NI          Tonometry (Tonopen, 9:30 AM)       Right Left   Pressure 18 19         Pupils       Pupils Dark Light Shape React APD   Right PERRL 3 2 Round  Brisk None   Left PERRL 3 2 Round Brisk None         Neuro/Psych     Oriented x3: Yes   Mood/Affect: Normal         Dilation     Right eye: 1.0% Mydriacyl, 2.5% Phenylephrine @ 9:31 AM           Slit Lamp and Fundus Exam     External Exam       Right Left   External Normal Normal         Slit Lamp Exam       Right Left   Lids/Lashes Normal Normal   Conjunctiva/Sclera White and quiet White and quiet   Cornea Clear Clear   Anterior Chamber Deep and quiet Deep and quiet   Iris Round and reactive Round and reactive   Lens Centered posterior chamber intraocular lens Centered posterior chamber intraocular lens   Anterior Vitreous Normal Clear avitric         Fundus Exam       Right Left   Posterior Vitreous clear , avitric, 40% gas    Disc Normal    C/D Ratio 0.25    Macula 20d, hole is closed    Vessels Normal    Periphery Normal, no holes or tears             IMAGING AND PROCEDURES  Imaging and Procedures for 07/29/21  OCT, Retina - OU - Both Eyes       Right Eye Quality was good.  Scan locations included subfoveal. Central Foveal Thickness: 297. Progression has worsened. Findings include macular hole, cystoid macular edema.   Left Eye Quality was good. Scan locations included subfoveal. Central Foveal Thickness: 287. Progression has been stable.   Notes OS with a history of macular hole with VMT, repair 2016.  Now with intact outer retina.  Stable  OD, with 40% gas, macular hole now closed.             ASSESSMENT/PLAN:  Macular hole of right eye 1 week postop vitrectomy membrane peel gas injection OD with 20/60 preoperative vision.  Acuity is now 20/50 and the macular hole is clearly closed by OCT examination today  Patient instructed to continue the current topical eye medications each each drop  Prednisolone acetate 1 drop right eye 4 times daily for 2 weeks  Ofloxacin 1 drop right eye 4 times daily for 2 weeks. Patient instructed not to refill these medications nor is the patient to continue to use them beyond 2 weeks from now     ICD-10-CM   1. Macular hole of right eye  H35.341 OCT, Retina - OU - Both Eyes      1.  Positioning patient instructed not to sleep or rest on her back for another week.  Patient instructed to maintain the green bracelet for 1 more week  2.  4 more days the patient may resume all activities with the exception of flying in airplanes, mountain climbing or driving in the mountains  3.  Ophthalmic Meds Ordered this visit:  No orders of the defined types were placed in this encounter.      Return in about 9 weeks (around 09/30/2021) for dilate, OD, POST OP, OCT.  Patient Instructions  Patient instructed to continue the current topical eye medications each each drop  Prednisolone acetate 1 drop right eye 4 times daily for 2 weeks  Ofloxacin 1 drop right eye 4 times daily for 2 weeks.  Patient instructed not to refill these medications nor is the patient to continue to use them beyond 2 weeks from now   Explained the  diagnoses, plan, and follow up with the patient and they expressed understanding.  Patient expressed understanding of the importance of proper follow up care.   Clent Demark Kevina Piloto M.D. Diseases & Surgery of the Retina and Vitreous Retina & Diabetic [place] 07/29/21     Abbreviations: M myopia (nearsighted); A astigmatism; H hyperopia (farsighted); P presbyopia; Mrx spectacle prescription;  CTL contact lenses; OD right eye; OS left eye; OU both eyes  XT exotropia; ET esotropia; PEK punctate epithelial keratitis; PEE punctate epithelial erosions; DES dry eye syndrome; MGD meibomian gland dysfunction; ATs artificial tears; PFAT's preservative free artificial tears; [place] nuclear sclerotic cataract; PSC posterior subcapsular cataract; ERM epi-retinal membrane; PVD posterior vitreous detachment; RD retinal detachment; DM diabetes mellitus; DR diabetic retinopathy; NPDR non-proliferative diabetic retinopathy; PDR proliferative diabetic retinopathy; CSME clinically significant macular edema; DME diabetic macular edema; dbh dot blot hemorrhages; CWS cotton wool spot; POAG primary open angle glaucoma; C/D cup-to-disc ratio; HVF humphrey visual field; GVF goldmann visual field; OCT optical coherence tomography; IOP intraocular pressure; BRVO Branch retinal vein occlusion; CRVO central retinal vein occlusion; CRAO central retinal artery occlusion; BRAO branch retinal artery occlusion; RT retinal tear; SB scleral buckle; PPV pars plana vitrectomy; VH Vitreous hemorrhage; PRP panretinal laser photocoagulation; IVK intravitreal kenalog; VMT vitreomacular traction; MH Macular hole;  NVD neovascularization of the disc; NVE neovascularization elsewhere; AREDS age related eye disease study; ARMD age related macular degeneration; POAG primary open angle glaucoma; EBMD epithelial/anterior basement membrane dystrophy; ACIOL anterior chamber intraocular lens; IOL intraocular lens; PCIOL posterior chamber intraocular lens;  Phaco/IOL phacoemulsification with intraocular lens placement; [place] photorefractive keratectomy; LASIK laser assisted in situ keratomileusis; HTN hypertension; DM diabetes mellitus; COPD chronic obstructive pulmonary disease

## 2021-07-29 NOTE — Patient Instructions (Signed)
Patient instructed to continue the current topical eye medications each each drop  Prednisolone acetate 1 drop right eye 4 times daily for 2 weeks  Ofloxacin 1 drop right eye 4 times daily for 2 weeks. Patient instructed not to refill these medications nor is the patient to continue to use them beyond 2 weeks from now

## 2021-07-29 NOTE — Assessment & Plan Note (Signed)
1 week postop vitrectomy membrane peel gas injection OD with 20/60 preoperative vision.  Acuity is now 20/50 and the macular hole is clearly closed by OCT examination today  Patient instructed to continue the current topical eye medications each each drop  Prednisolone acetate 1 drop right eye 4 times daily for 2 weeks  Ofloxacin 1 drop right eye 4 times daily for 2 weeks. Patient instructed not to refill these medications nor is the patient to continue to use them beyond 2 weeks from now

## 2021-09-22 IMAGING — CT CT HEAD W/O CM
4 series · 17 of 47 positions shown, 19 images · non-contrast
Comparison: None.

CLINICAL DATA: Recent fall

EXAM:
CT HEAD WITHOUT CONTRAST
TECHNIQUE: Contiguous axial images were obtained from the base of the skull
through the vertex without intravenous contrast.

[Series 2: head wo · axial · 0.39mm/px · z∈[-130,-20]mm · 7 of 30 slices shown, 9 images]
[im 4/30  brain]
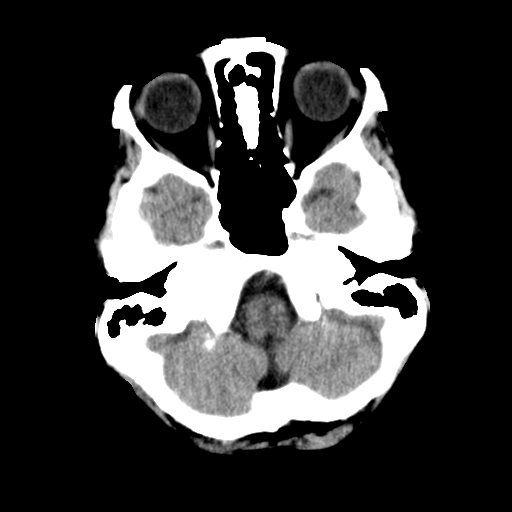
[im 4/30  bone]
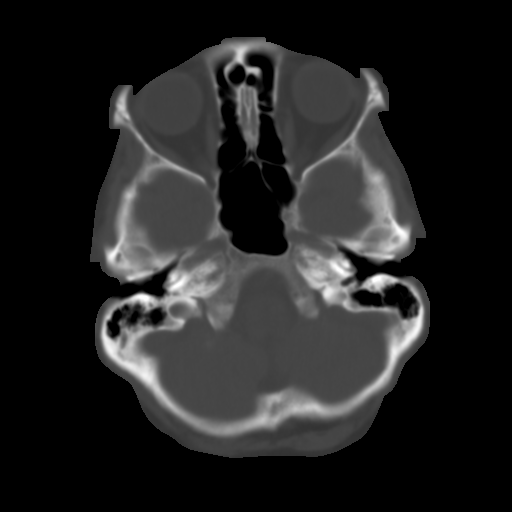
[im 8/30  brain]
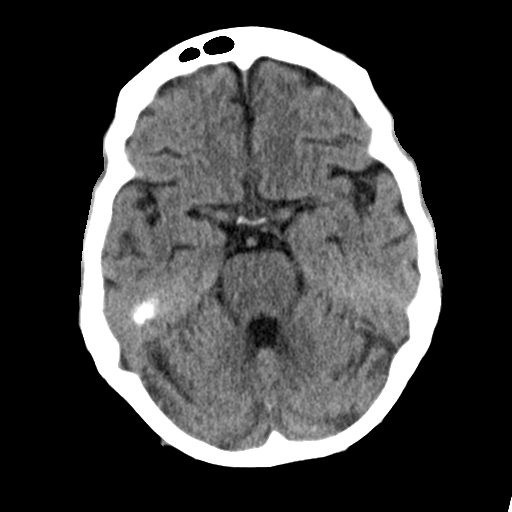
[im 11/30  brain]
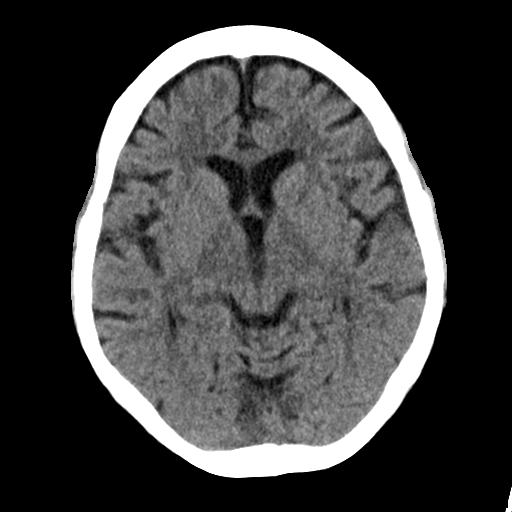
[im 15/30  brain]
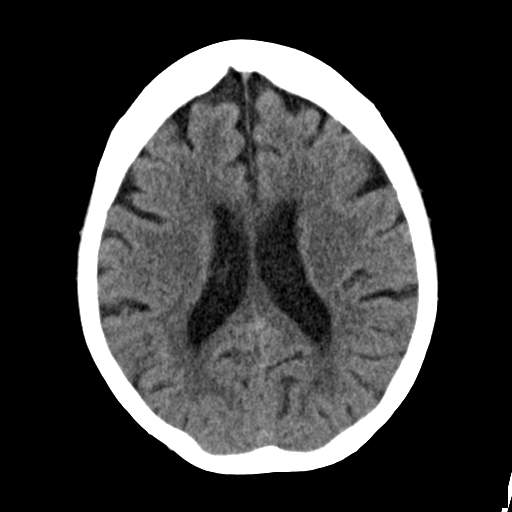
[im 19/30  brain]
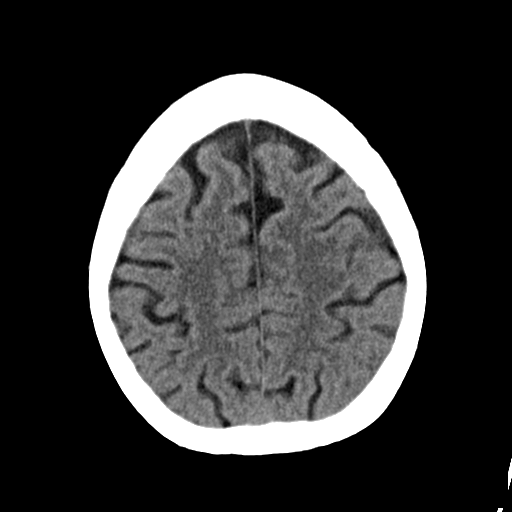
[im 19/30  bone]
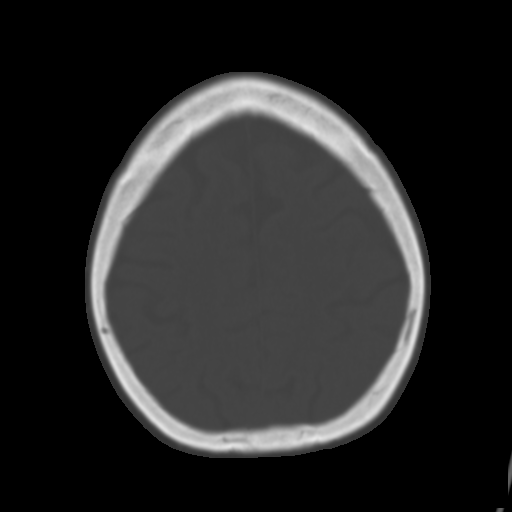
[im 22/30  brain]
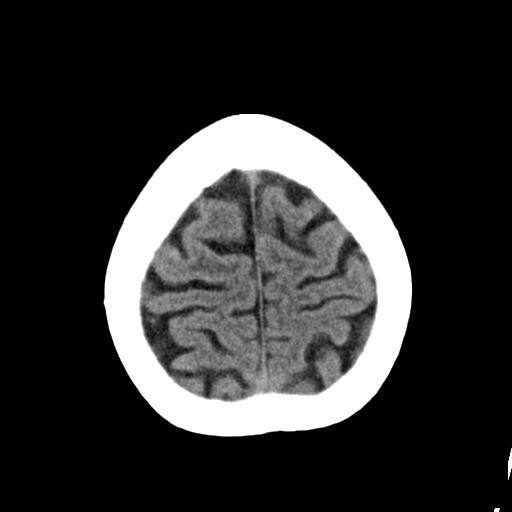
[im 26/30  brain]
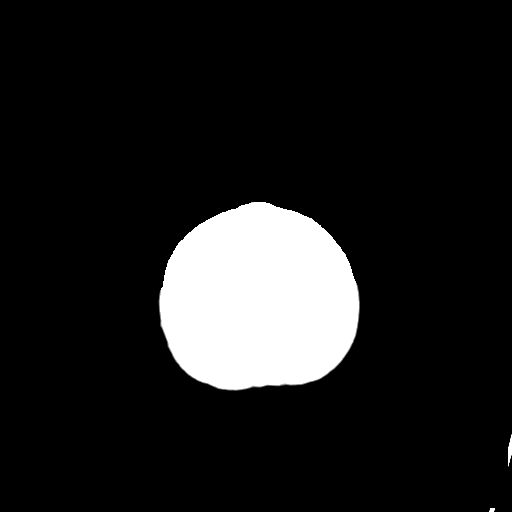

[Series 3: head bone · axial · 0.39mm/px · z∈[-131,-79]mm · 4 of 75 slices shown]
[im 8/75  bone]
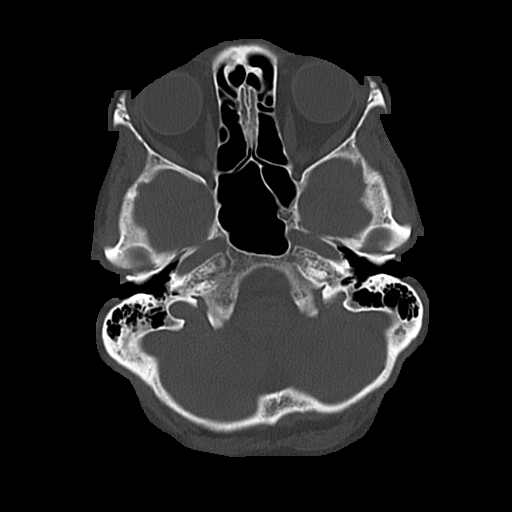
[im 15/75  bone]
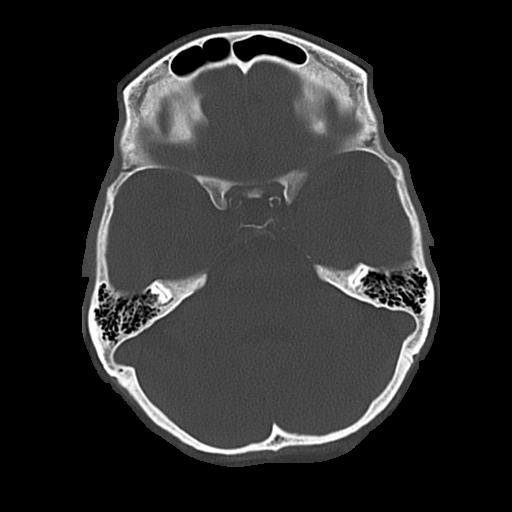
[im 23/75  bone]
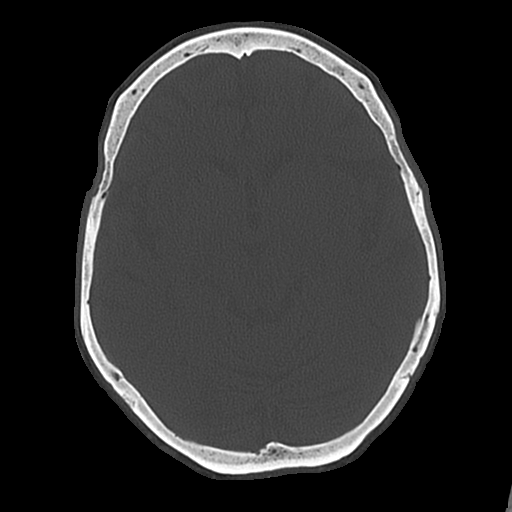
[im 34/75  bone]
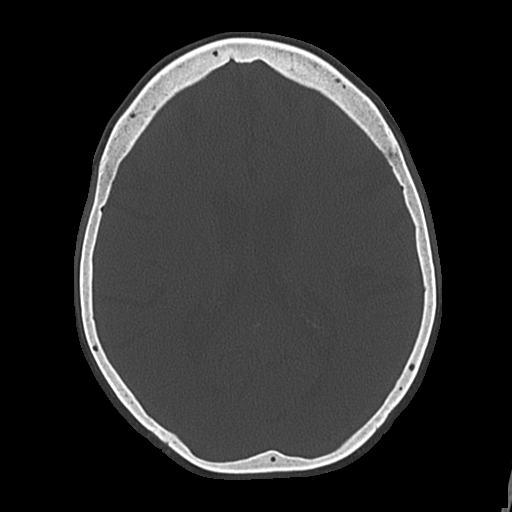

[Series 4: coronal soft · coronal · 0.30mm/px · 3 of 61 slices shown]
[im 21/61  brain]
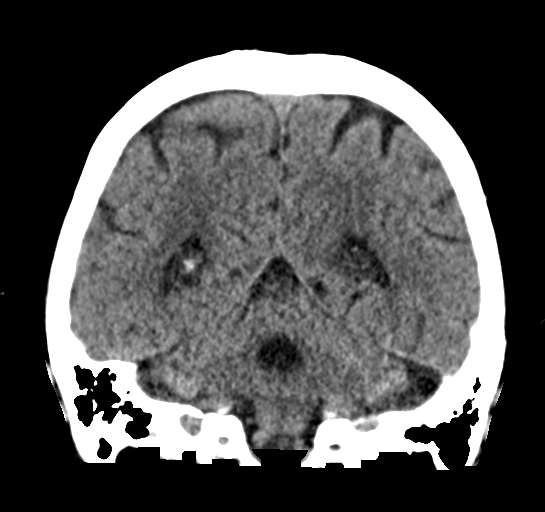
[im 27/61  brain]
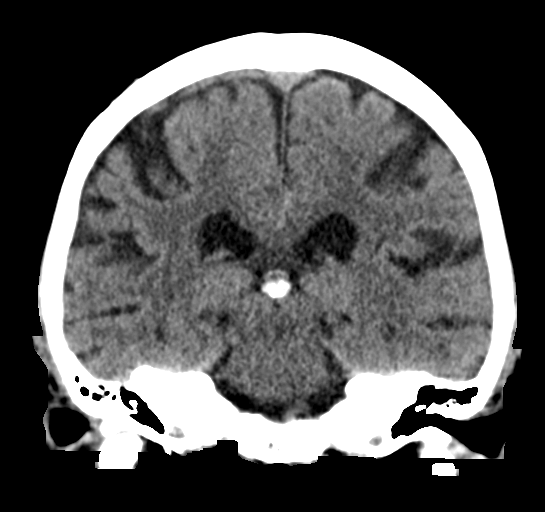
[im 34/61  brain]
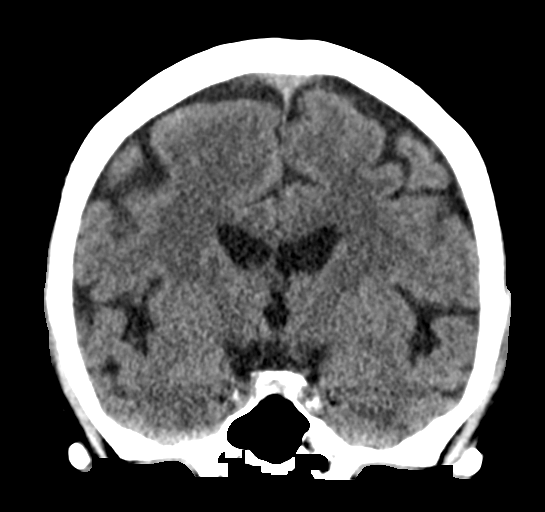

[Series 5: sagittal soft · sagittal · 0.29mm/px · 3 of 54 slices shown]
[im 18/54  brain]
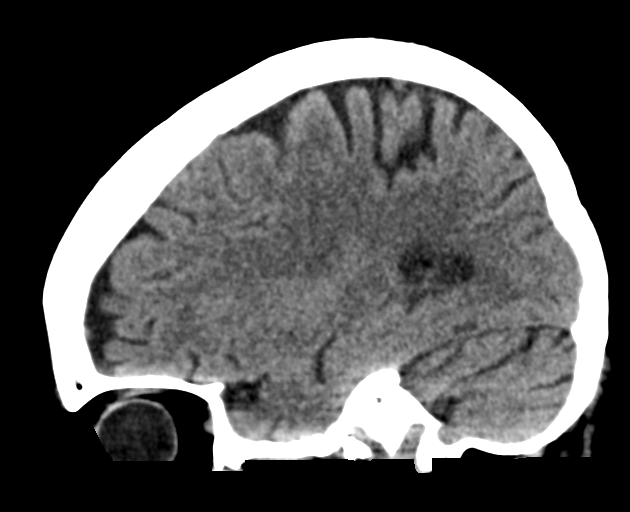
[im 27/54  brain]
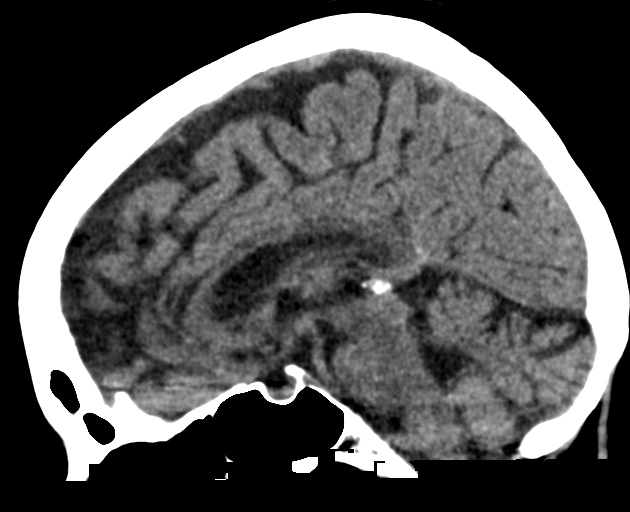
[im 36/54  brain]
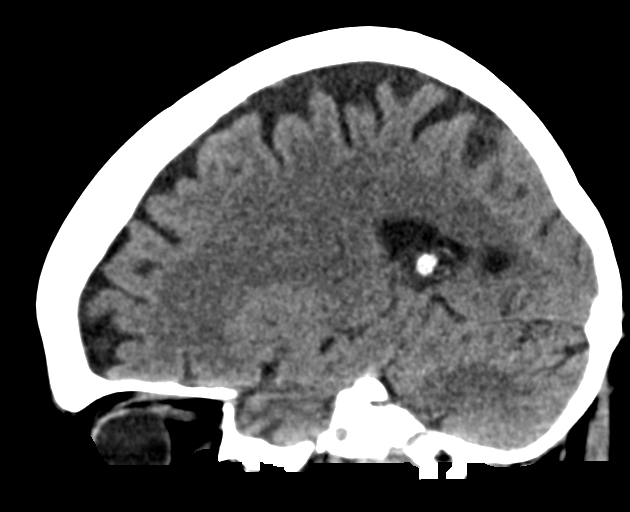

[17 of 47 positions shown; findings below may reference images not displayed]

FINDINGS: Brain: No evidence of acute infarction, hemorrhage, hydrocephalus,
extra-axial collection or mass lesion/mass effect. Chronic atrophic
and ischemic changes are noted.

Vascular: No hyperdense vessel or unexpected calcification.

Skull: Normal. Negative for fracture or focal lesion.

Sinuses/Orbits: No acute finding.

Other: Some air is noted along the nasal bridge likely related to
the recent injury. The nasal bones are incompletely evaluated on
this exam.
IMPRESSION: Chronic atrophic and ischemic changes. No acute intracranial
abnormality noted.

Subcutaneous air along the nasal bridge which may be related to the
recent injury. The need for further evaluation can be determined on
a clinical basis.

## 2021-09-30 ENCOUNTER — Encounter (INDEPENDENT_AMBULATORY_CARE_PROVIDER_SITE_OTHER): Payer: Self-pay | Admitting: Ophthalmology

## 2021-09-30 ENCOUNTER — Other Ambulatory Visit: Payer: Self-pay

## 2021-09-30 ENCOUNTER — Ambulatory Visit (INDEPENDENT_AMBULATORY_CARE_PROVIDER_SITE_OTHER): Payer: Medicare Other | Admitting: Ophthalmology

## 2021-09-30 DIAGNOSIS — H35341 Macular cyst, hole, or pseudohole, right eye: Secondary | ICD-10-CM

## 2021-09-30 NOTE — Assessment & Plan Note (Signed)
Preoperative visual acuity at 20/60 has now improved to 20/20 OD post vitrectomy membrane peel gas injection for macular hole repair.

## 2021-09-30 NOTE — Progress Notes (Signed)
09/30/2021     CHIEF COMPLAINT Patient presents for  Chief Complaint  Patient presents with   Post-op Follow-up      HISTORY OF PRESENT ILLNESS: Ann [name] is a 86 y.o. female who presents to the clinic today for:   OD, vastly improved acuity and anatomy post vitrectomy for macular hole and vision loss to the level of 20/60  HPI     Post-op Follow-up   In right eye.  Discomfort includes Negative for pain, itching, foreign body sensation and tearing.  Vision is improved.        Comments   9 week post op OD and OCT Sx on 07/22/2021 Pt states, "I think my vision is great. I am very pleased." Pt states VA OU stable since last visit. Pt denies FOL, floaters, or ocular pain OU.        Last edited by Kendra Opitz, COA on 09/30/2021  9:36 AM.      Referring physician: Hoyt Koch, MD [place],  [place] [id number]  HISTORICAL INFORMATION:   Selected notes from the MEDICAL RECORD NUMBER    Lab Results  Component Value Date   HGBA1C 5.7 03/27/2021     CURRENT MEDICATIONS: No current outpatient medications on file. (Ophthalmic Drugs)   No current facility-administered medications for this visit. (Ophthalmic Drugs)   Current Outpatient Medications (Other)  Medication Sig   B Complex Vitamins (B COMPLEX 1 PO) Take 1 tablet by mouth daily.   Biotin w/ Vitamins C & E (HAIR/SKIN/NAILS PO) Take 1 tablet by mouth daily.   calcium carbonate (OSCAL) 1500 (600 Ca) MG TABS tablet Take 600 mg of elemental calcium by mouth daily with breakfast.   ferrous sulfate 325 (65 FE) MG tablet Take 325 mg by mouth daily with breakfast.   magnesium 30 MG tablet Take 30 mg by mouth 2 (two) times daily.   No current facility-administered medications for this visit. (Other)      REVIEW OF SYSTEMS:    ALLERGIES No Known Allergies  PAST MEDICAL HISTORY Past Medical History:  Diagnosis Date   Chicken pox    History reviewed. No pertinent surgical  history.  FAMILY HISTORY Family History  Problem Relation Age of Onset   Cancer Mother    Diabetes Mother    Early death Mother    Arthritis Father    Cancer Sister    Heart disease Brother    Heart attack Brother    Diabetes Sister    Stroke Sister     SOCIAL HISTORY Social History   Tobacco Use   Smoking status: Never   Smokeless tobacco: Never  Vaping Use   Vaping Use: Never used  Substance Use Topics   Alcohol use: Yes   Drug use: Never         OPHTHALMIC EXAM:  Base Eye Exam     Visual Acuity (ETDRS)       Right Left   Dist [place] 20/20 20/25 +2         Tonometry (Tonopen, 9:40 AM)       Right Left   Pressure 17 14         Pupils       Pupils Dark Light Shape React APD   Right PERRL 3 2 Round Brisk None   Left PERRL 3 2 Round Brisk None         Visual Fields (Counting fingers)       Left Right  Full Full         Extraocular Movement       Right Left    Full Full         Neuro/Psych     Oriented x3: Yes   Mood/Affect: Normal         Dilation     Right eye: 1.0% Mydriacyl, 2.5% Phenylephrine @ 9:40 AM           Slit Lamp and Fundus Exam     External Exam       Right Left   External Normal Normal         Slit Lamp Exam       Right Left   Lids/Lashes Normal Normal   Conjunctiva/Sclera White and quiet White and quiet   Cornea Clear Clear   Anterior Chamber Deep and quiet Deep and quiet   Iris Round and reactive Round and reactive   Lens Centered posterior chamber intraocular lens Centered posterior chamber intraocular lens   Anterior Vitreous Normal Clear avitric         Fundus Exam       Right Left   Posterior Vitreous clear , avitric,     Disc Normal    C/D Ratio 0.25    Macula 20d,  90d hole is closed    Vessels Normal    Periphery Normal, no holes or tears             IMAGING AND PROCEDURES  Imaging and Procedures for 09/30/21  OCT, Retina - OU - Both Eyes       Right  Eye Quality was good. Scan locations included subfoveal. Central Foveal Thickness: 274. Progression has improved.   Left Eye Quality was good. Scan locations included subfoveal. Central Foveal Thickness: 287. Progression has been stable.   Notes OS with a history of macular hole with VMT, repair 2016.  Now with intact outer retina.  Stable  OD macular hole now closed             ASSESSMENT/PLAN:  Macular hole of right eye Preoperative visual acuity at 20/60 has now improved to 20/20 OD post vitrectomy membrane peel gas injection for macular hole repair.     ICD-10-CM   1. Macular hole of right eye  H35.341 OCT, Retina - OU - Both Eyes      1.  OD looks great, RV to this office on a as needed basis  2.  Follow-up with Dr. Nathanial Rancher as scheduled  3.  Ophthalmic Meds Ordered this visit:  No orders of the defined types were placed in this encounter.      Return if symptoms worsen or fail to improve.  There are no Patient Instructions on file for this visit.   Explained the diagnoses, plan, and follow up with the patient and they expressed understanding.  Patient expressed understanding of the importance of proper follow up care.   Clent Demark Cristina Ceniceros M.D. Diseases & Surgery of the Retina and Vitreous Retina & Diabetic [place] 09/30/21     Abbreviations: M myopia (nearsighted); A astigmatism; H hyperopia (farsighted); P presbyopia; Mrx spectacle prescription;  CTL contact lenses; OD right eye; OS left eye; OU both eyes  XT exotropia; ET esotropia; PEK punctate epithelial keratitis; PEE punctate epithelial erosions; DES dry eye syndrome; MGD meibomian gland dysfunction; ATs artificial tears; PFAT's preservative free artificial tears; [place] nuclear sclerotic cataract; PSC posterior subcapsular cataract; ERM epi-retinal membrane; PVD posterior vitreous detachment; RD retinal detachment; DM  diabetes mellitus; DR diabetic retinopathy; NPDR non-proliferative diabetic  retinopathy; PDR proliferative diabetic retinopathy; CSME clinically significant macular edema; DME diabetic macular edema; dbh dot blot hemorrhages; CWS cotton wool spot; POAG primary open angle glaucoma; C/D cup-to-disc ratio; HVF humphrey visual field; GVF goldmann visual field; OCT optical coherence tomography; IOP intraocular pressure; BRVO Branch retinal vein occlusion; CRVO central retinal vein occlusion; CRAO central retinal artery occlusion; BRAO branch retinal artery occlusion; RT retinal tear; SB scleral buckle; PPV pars plana vitrectomy; VH Vitreous hemorrhage; PRP panretinal laser photocoagulation; IVK intravitreal kenalog; VMT vitreomacular traction; MH Macular hole;  NVD neovascularization of the disc; NVE neovascularization elsewhere; AREDS age related eye disease study; ARMD age related macular degeneration; POAG primary open angle glaucoma; EBMD epithelial/anterior basement membrane dystrophy; ACIOL anterior chamber intraocular lens; IOL intraocular lens; PCIOL posterior chamber intraocular lens; Phaco/IOL phacoemulsification with intraocular lens placement; [place] photorefractive keratectomy; LASIK laser assisted in situ keratomileusis; HTN hypertension; DM diabetes mellitus; COPD chronic obstructive pulmonary disease

## 2021-10-09 ENCOUNTER — Other Ambulatory Visit: Payer: Medicare Other

## 2022-04-15 ENCOUNTER — Ambulatory Visit (INDEPENDENT_AMBULATORY_CARE_PROVIDER_SITE_OTHER): Payer: Medicare Other | Admitting: Internal Medicine

## 2022-04-15 ENCOUNTER — Telehealth: Payer: Self-pay

## 2022-04-15 ENCOUNTER — Encounter: Payer: Self-pay | Admitting: Internal Medicine

## 2022-04-15 VITALS — BP 124/90 | HR 43 | Resp 18 | Ht 63.5 in | Wt 112.2 lb

## 2022-04-15 DIAGNOSIS — R7301 Impaired fasting glucose: Secondary | ICD-10-CM

## 2022-04-15 DIAGNOSIS — Z833 Family history of diabetes mellitus: Secondary | ICD-10-CM | POA: Diagnosis not present

## 2022-04-15 DIAGNOSIS — D649 Anemia, unspecified: Secondary | ICD-10-CM | POA: Diagnosis not present

## 2022-04-15 DIAGNOSIS — R1031 Right lower quadrant pain: Secondary | ICD-10-CM | POA: Diagnosis not present

## 2022-04-15 DIAGNOSIS — Z0000 Encounter for general adult medical examination without abnormal findings: Secondary | ICD-10-CM | POA: Diagnosis not present

## 2022-04-15 DIAGNOSIS — Z13220 Encounter for screening for lipoid disorders: Secondary | ICD-10-CM

## 2022-04-15 DIAGNOSIS — R634 Abnormal weight loss: Secondary | ICD-10-CM | POA: Insufficient documentation

## 2022-04-15 LAB — CBC
HCT: 25.4 % — ABNORMAL LOW (ref 36.0–46.0)
Hemoglobin: 8 g/dL — CL (ref 12.0–15.0)
MCHC: 31.7 g/dL (ref 30.0–36.0)
MCV: 66.9 fl — ABNORMAL LOW (ref 78.0–100.0)
Platelets: 384 10*3/uL (ref 150.0–400.0)
RBC: 3.79 Mil/uL — ABNORMAL LOW (ref 3.87–5.11)
RDW: 16.9 % — ABNORMAL HIGH (ref 11.5–15.5)
WBC: 8.1 10*3/uL (ref 4.0–10.5)

## 2022-04-15 LAB — HEMOGLOBIN A1C: Hgb A1c MFr Bld: 5.8 % (ref 4.6–6.5)

## 2022-04-15 LAB — LIPID PANEL
Cholesterol: 160 mg/dL (ref 0–200)
HDL: 58.9 mg/dL (ref >39.00)
LDL Cholesterol: 70 mg/dL (ref 0–99)
NonHDL: 101.32
Total CHOL/HDL Ratio: 3
Triglycerides: 156 mg/dL — ABNORMAL HIGH (ref 0.0–149.0)
VLDL: 31.2 mg/dL (ref 0.0–40.0)

## 2022-04-15 LAB — COMPREHENSIVE METABOLIC PANEL
ALT: 9 U/L (ref 0–35)
AST: 14 U/L (ref 0–37)
Albumin: 3.7 g/dL (ref 3.5–5.2)
Alkaline Phosphatase: 72 U/L (ref 39–117)
BUN: 17 mg/dL (ref 6–23)
CO2: 23 mEq/L (ref 19–32)
Calcium: 8.9 mg/dL (ref 8.4–10.5)
Chloride: 103 mEq/L (ref 96–112)
Creatinine, Ser: 1 mg/dL (ref 0.40–1.20)
GFR: 51.69 mL/min — ABNORMAL LOW (ref >60.00)
Glucose, Bld: 127 mg/dL — ABNORMAL HIGH (ref 70–99)
Potassium: 3.9 mEq/L (ref 3.5–5.1)
Sodium: 136 mEq/L (ref 135–145)
Total Bilirubin: 0.4 mg/dL (ref 0.2–1.2)
Total Protein: 6.8 g/dL (ref 6.0–8.3)

## 2022-04-15 NOTE — Assessment & Plan Note (Signed)
She is down about 15 pounds in the last year which is about 10% of her body weight unintentionally. Never smoker, no recent mammogram and no prior colonoscopy. Anemia on labs last year which were not followed up due to being unable to contact patient. Concerning and will check CBC, CMP, HgA1c, CT abdomen and pelvis.  ?

## 2022-04-15 NOTE — Assessment & Plan Note (Signed)
Recheck CBC today. Prior Hg 10 without baseline for comparison. She has lost about 15 pounds in the last year without prior colonoscopy.  ?

## 2022-04-15 NOTE — Assessment & Plan Note (Signed)
Checking HgA1c, she is having new weight loss which is unexplained need to rule out new diabetes.  ?

## 2022-04-15 NOTE — Assessment & Plan Note (Signed)
Flu shot declines. Covid-19 declines. Pneumonia declines. Shingrix declines. Tetanus declines. Colonoscopy no prior aged out. Mammogram no recent prior aged out, pap smear aged out and dexa last 2016 due ordered last year she did not get done. Counseled about sun safety and mole surveillance. Counseled about the dangers of distracted driving. Given 10 year screening recommendations.  ? ?

## 2022-04-15 NOTE — Progress Notes (Signed)
? ?Subjective:  ? ?Patient ID: Ann [name], female    DOB: 01-Jun-1935, 87 y.o.   MRN: [id number] ? ?HPI ?Here for medicare wellness, with new complaints of weight loss. Please see A/P for status and treatment of chronic medical problems.  ? ?Diet: heart healthy ?Physical activity: sedentary ?Depression/mood screen: negative ?Hearing: intact to whispered voice ?Visual acuity: grossly normal with lens, performs annual eye exam  ?ADLs: capable ?Fall risk: none ?Home safety: good ?Cognitive evaluation: intact to orientation, naming, recall and repetition ?EOL planning: adv directives discussed ? ?[place] Office Visit from 04/15/2022 in [place] at [place] [facility]  ?PHQ-2 Total Score 0  ? ?  ?  ?[place] Office Visit from 04/15/2022 in [facility] at [place]  ?PHQ-9 Total Score 0  ? ?  ? ? ?  06/16/2021  ? 12:55 PM 04/15/2022  ? 10:10 AM  ?Fall Risk  ?Falls in the past year?  1  ?Was there an injury with Fall?  0  ?Fall Risk Category Calculator  1  ?Fall Risk Category  Low  ?Patient Fall Risk Level Low fall risk   ? ? ?I have personally reviewed and have noted ?1. The patient's medical and social history - reviewed today no changes ?2. Their use of alcohol, tobacco or illicit drugs ?3. Their current medications and supplements ?4. The patient's functional ability including ADL's, fall risks, home safety risks and hearing or visual impairment. ?5. Diet and physical activities ?6. Evidence for depression or mood disorders ?7. Care team reviewed and updated ?8.  The patient is not on an opioid pain medication. ? ?Patient Care Team: ?Hoyt Koch, MD as PCP - General (Internal Medicine) ?[facility] ?Past Medical History:  ?Diagnosis Date  ? Chicken pox   ? ?History reviewed. No pertinent surgical history. ?Family History  ?Problem Relation Age of Onset  ? Cancer Mother   ? Diabetes Mother   ? Early death Mother   ? Arthritis Father   ? Cancer Sister   ? Heart disease  Brother   ? Heart attack Brother   ? Diabetes Sister   ? Stroke Sister   ? ?Review of Systems  ?Constitutional: Negative.   ?HENT: Negative.    ?Eyes: Negative.   ?Respiratory:  Negative for cough, chest tightness and shortness of breath.   ?Cardiovascular:  Negative for chest pain, palpitations and leg swelling.  ?Gastrointestinal:  Negative for abdominal distention, abdominal pain, constipation, diarrhea, nausea and vomiting.  ?Musculoskeletal: Negative.   ?Skin: Negative.   ?Neurological: Negative.   ?Psychiatric/Behavioral: Negative.    ? ?Objective:  ?Physical Exam ?Constitutional:   ?   Appearance: She is well-developed.  ?   Comments: thin  ?HENT:  ?   Head: Normocephalic and atraumatic.  ?Cardiovascular:  ?   Rate and Rhythm: Normal rate and regular rhythm.  ?Pulmonary:  ?   Effort: Pulmonary effort is normal. No respiratory distress.  ?   Breath sounds: Normal breath sounds. No wheezing or rales.  ?Abdominal:  ?   General: Bowel sounds are normal. There is no distension.  ?   Palpations: Abdomen is soft.  ?   Tenderness: There is no abdominal tenderness. There is no rebound.  ?   Comments: No mass detected  ?Musculoskeletal:  ?   Cervical back: Normal range of motion.  ?Skin: ?   General: Skin is warm and dry.  ?Neurological:  ?   Mental Status: She is alert and oriented to person, place,  and time.  ?   Coordination: Coordination normal.  ? ? ?Vitals:  ? 04/15/22 1005  ?BP: 124/90  ?Pulse: (!) 43  ?Resp: 18  ?SpO2: 100%  ?Weight: 112 lb 3.2 oz (50.9 kg)  ?Height: 5' 3.5" (1.613 m)  ? ?This visit occurred during the SARS-CoV-2 public health emergency.  Safety protocols were in place, including screening questions prior to the visit, additional usage of staff PPE, and extensive cleaning of exam room while observing appropriate contact time as indicated for disinfecting solutions.  ? ?Assessment & Plan:  ? ?

## 2022-04-15 NOTE — Assessment & Plan Note (Signed)
She is having RLQ discomfort with some constipation intermittent in the setting of new unintentional weight loss and anemia. With no prior colonoscopy will need evaluation and ordered CBC, CMP, CT abdomen and pelvis.  ?

## 2022-04-15 NOTE — Patient Instructions (Signed)
We will get the labs today and the scan of the stomach/pelvis to check. ?

## 2022-04-15 NOTE — Telephone Encounter (Signed)
CRITICAL VALUE STICKER ? ?CRITICAL VALUE: Hemoglobin 8.0 ? ?RECEIVER (on-site recipient of call): [place], [place] ? ?DATE & TIME NOTIFIED: 04/15/2022 at 11:35 am ? ?MESSENGER (representative from lab): Santiago Glad ? ?MD NOTIFIED: Sharlet Salina ? ?TIME OF NOTIFICATION: 11:35 am ? ?RESPONSE:  ? ?

## 2022-04-15 NOTE — Telephone Encounter (Signed)
Will address in result notes ?

## 2022-04-16 ENCOUNTER — Ambulatory Visit: Payer: Medicare Other

## 2022-04-16 DIAGNOSIS — Z780 Asymptomatic menopausal state: Secondary | ICD-10-CM

## 2022-04-16 DIAGNOSIS — Z0000 Encounter for general adult medical examination without abnormal findings: Secondary | ICD-10-CM

## 2022-04-16 NOTE — Progress Notes (Deleted)
Subjective:   Ann [name] is a 87 y.o. female who presents for Medicare Annual (Subsequent) preventive examination.   I connected with Ann [name] today by telephone and verified that I am speaking with the correct person using two identifiers. Location patient: home Location provider: work Persons participating in the virtual visit: patient, provider.   I discussed the limitations, risks, security and privacy concerns of performing an evaluation and management service by telephone and the availability of in person appointments. I also discussed with the patient that there may be a patient responsible charge related to this service. The patient expressed understanding and verbally consented to this telephonic visit.    Interactive audio and video telecommunications were attempted between this provider and patient, however failed, due to patient having technical difficulties OR patient did not have access to video capability.  We continued and completed visit with audio only.    Review of Systems     Cardiac Risk Factors include: advanced age (>57men, >47 women)     Objective:    Today's Vitals   There is no height or weight on file to calculate BMI.     04/16/2022    9:49 AM  Advanced Directives  Does Patient Have a Medical Advance Directive? Yes  Type of Estate agent of [place];Living will  Copy of Healthcare Power of Attorney in Chart? No - copy requested    Current Medications (verified) Outpatient Encounter Medications as of 04/16/2022  Medication Sig   B Complex Vitamins (B COMPLEX 1 PO) Take 1 tablet by mouth daily.   Biotin w/ Vitamins C & E (HAIR/SKIN/NAILS PO) Take 1 tablet by mouth daily.   calcium carbonate (OSCAL) 1500 (600 Ca) MG TABS tablet Take 600 mg of elemental calcium by mouth daily with breakfast.   ferrous sulfate 325 (65 FE) MG tablet Take 325 mg by mouth daily with breakfast.   magnesium 30 MG tablet Take 30 mg by mouth 2  (two) times daily.   No facility-administered encounter medications on file as of 04/16/2022.    Allergies (verified) Patient has no known allergies.   History: Past Medical History:  Diagnosis Date   Chicken pox    History reviewed. No pertinent surgical history. Family History  Problem Relation Age of Onset   Cancer Mother    Diabetes Mother    Early death Mother    Arthritis Father    Cancer Sister    Heart disease Brother    Heart attack Brother    Diabetes Sister    Stroke Sister    Social History   Socioeconomic History   Marital status: Widowed    Spouse name: Not on file   Number of children: Not on file   Years of education: Not on file   Highest education level: Not on file  Occupational History   Not on file  Tobacco Use   Smoking status: Never   Smokeless tobacco: Never  Vaping Use   Vaping Use: Never used  Substance and Sexual Activity   Alcohol use: Yes   Drug use: Never   Sexual activity: Not Currently    Partners: Male  Other Topics Concern   Not on file  Social History Narrative   Not on file   Social Determinants of Health   Financial Resource Strain: Low Risk    Difficulty of Paying Living Expenses: Not hard at all  Food Insecurity: No Food Insecurity   Worried About Running Out of Food in the  Last Year: Never true   Ran Out of Food in the Last Year: Never true  Transportation Needs: No Transportation Needs   Lack of Transportation (Medical): No   Lack of Transportation (Non-Medical): No  Physical Activity: Insufficiently Active   Days of Exercise per Week: 3 days   Minutes of Exercise per Session: 30 min  Stress: No Stress Concern Present   Feeling of Stress : Not at all  Social Connections: Moderately Integrated   Frequency of Communication with Friends and Family: Twice a week   Frequency of Social Gatherings with Friends and Family: Twice a week   Attends Religious Services: More than 4 times per year   Active Member of [employer]  or Organizations: Yes   Attends Banker Meetings: 1 to 4 times per year   Marital Status: Widowed    Tobacco Counseling Counseling given: Not Answered   Clinical Intake:  Pre-visit preparation completed: Yes  Pain : No/denies pain     Nutritional Risks: None Diabetes: No  How often do you need to have someone help you when you read instructions, pamphlets, or other written materials from your doctor or pharmacy?: 1 - Never What is the last grade level you completed in school?: masters  Diabetic?no   Interpreter Needed?: No  Information entered by :: [name],LPN   Activities of Daily Living    04/16/2022    9:50 AM  In your present state of health, do you have any difficulty performing the following activities:  Hearing? 0  Vision? 0  Difficulty concentrating or making decisions? 0  Walking or climbing stairs? 0  Dressing or bathing? 0  Doing errands, shopping? 0  Preparing Food and eating ? N  Using the Toilet? N  In the past six months, have you accidently leaked urine? N  Do you have problems with loss of bowel control? N  Managing your Medications? N  Managing your Finances? N  Housekeeping or managing your Housekeeping? N    Patient Care Team: Myrlene Broker, MD as PCP - General (Internal Medicine) [facility]  Indicate any recent Medical Services you may have received from other than [facility] providers in the past year (date may be approximate).     Assessment:   This is a routine wellness examination for [facility].  Hearing/Vision screen Vision Screening - Comments:: Annual eye exams wear glasses   Dietary issues and exercise activities discussed: Current Exercise Habits: Home exercise routine, Type of exercise: walking, Time (Minutes): 30, Frequency (Times/Week): 3, Weekly Exercise (Minutes/Week): 90, Intensity: Mild, Exercise limited by: None identified   Goals Addressed   None    Depression Screen     04/16/2022    9:50 AM 04/15/2022   10:10 AM 03/27/2021    8:53 AM  PHQ 2/9 Scores  PHQ - 2 Score 0 0 0  PHQ- 9 Score  0     Fall Risk    04/16/2022    9:50 AM 04/15/2022   10:10 AM  Fall Risk   Falls in the past year? 0 1  Number falls in past yr: 0 0  Injury with Fall? 0 0  Follow up Falls evaluation completed     FALL RISK PREVENTION PERTAINING TO THE HOME:  Any stairs in or around the home? No  If so, are there any without handrails? Yes  Home free of loose throw rugs in walkways, pet beds, electrical cords, etc? Yes  Adequate lighting in your home to reduce risk  of falls? Yes   ASSISTIVE DEVICES UTILIZED TO PREVENT FALLS:  Life alert? No  Use of a cane, walker or w/c? No  Grab bars in the bathroom? No  Shower chair or bench in shower? No  Elevated toilet seat or a handicapped toilet? No   Cognitive Function:  Normal cognitive status assessed by telephone conversation by this Nurse Health Advisor. No abnormalities found.        Immunizations Immunization History  Administered Date(s) Administered   [employer] Vaccine Bivalent Booster 26yrs & up 08/20/2021   Moderna Sars-Covid-2 Vaccination 12/31/2019, 01/28/2020, 10/23/2020    TDAP status: Due, Education has been provided regarding the importance of this vaccine. Advised may receive this vaccine at local pharmacy or Health Dept. Aware to provide a copy of the vaccination record if obtained from local pharmacy or Health Dept. Verbalized acceptance and understanding.  Flu Vaccine status: Declined, Education has been provided regarding the importance of this vaccine but patient still declined. Advised may receive this vaccine at local pharmacy or Health Dept. Aware to provide a copy of the vaccination record if obtained from local pharmacy or Health Dept. Verbalized acceptance and understanding.  Pneumococcal vaccine status: Declined,  Education has been provided regarding the importance of this vaccine but patient  still declined. Advised may receive this vaccine at local pharmacy or Health Dept. Aware to provide a copy of the vaccination record if obtained from local pharmacy or Health Dept. Verbalized acceptance and understanding.   Covid-19 vaccine status: Completed vaccines  Qualifies for Shingles Vaccine? Yes   Zostavax completed No   Shingrix Completed?: No.    Education has been provided regarding the importance of this vaccine. Patient has been advised to call insurance company to determine out of pocket expense if they have not yet received this vaccine. Advised may also receive vaccine at local pharmacy or Health Dept. Verbalized acceptance and understanding.  Screening Tests Health Maintenance  Topic Date Due   Pneumonia Vaccine 60+ Years old (1 - PCV) Never done   Zoster Vaccines- Shingrix (1 of 2) 07/15/2022 (Originally [birth year]1987)   TETANUS/TDAP  04/16/2023 (Originally 08/28/1954)   INFLUENZA VACCINE  07/20/2022   DEXA SCAN  Completed   COVID-19 Vaccine  Completed   HPV VACCINES  Aged Out    Health Maintenance  Health Maintenance Due  Topic Date Due   Pneumonia Vaccine 5+ Years old (1 - PCV) Never done    Colorectal cancer screening: No longer required.   Mammogram status: No longer required due to age.  Bone Density status: Ordered 04/16/2022. Pt provided with contact info and advised to call to schedule appt.  Lung Cancer Screening: (Low Dose CT Chest recommended if Age 18-80 years, 30 pack-year currently smoking OR have quit w/in 15years.) does not qualify.   Lung Cancer Screening Referral: n/a  Additional Screening:  Hepatitis C Screening: does not qualify;   Vision Screening: Recommended annual ophthalmology exams for early detection of glaucoma and other disorders of the eye. Is the patient up to date with their annual eye exam?  Yes  Who is the provider or what is the name of the office in which the patient attends annual eye exams? Dr.Groat  If pt is not  established with a provider, would they like to be referred to a provider to establish care? No .   Dental Screening: Recommended annual dental exams for proper oral hygiene  Community Resource Referral / Chronic Care Management: CRR required this visit?  No  CCM required this visit?  No      Plan:     I have personally reviewed and noted the following in the patient's chart:   Medical and social history Use of alcohol, tobacco or illicit drugs  Current medications and supplements including opioid prescriptions.  Functional ability and status Nutritional status Physical activity Advanced directives List of other physicians Hospitalizations, surgeries, and ER visits in previous 12 months Vitals Screenings to include cognitive, depression, and falls Referrals and appointments  In addition, I have reviewed and discussed with patient certain preventive protocols, quality metrics, and best practice recommendations. A written personalized care plan for preventive services as well as general preventive health recommendations were provided to patient.     March Rummage, LPN   1/61/0960   Nurse Notes: none

## 2022-04-16 NOTE — Addendum Note (Signed)
Addended by: Randel Pigg on: 04/16/2022 11:56 AM ? ? Modules accepted: Level of Service ? ?

## 2022-04-16 NOTE — Patient Instructions (Signed)
Ann [name] , ?Thank you for taking time to come for your Medicare Wellness Visit. I appreciate your ongoing commitment to your health goals. Please review the following plan we discussed and let me know if I can assist you in the future.  ? ?Screening recommendations/referrals: ?Colonoscopy: no longer required  ?Mammogram: no longer required  ?Bone Density: referral 04/16/2022 ?Recommended yearly ophthalmology/optometry visit for glaucoma screening and checkup ?Recommended yearly dental visit for hygiene and checkup ? ?Vaccinations: ?Influenza vaccine:  completed  ?Pneumococcal vaccine: declined  ?Tdap vaccine: due ?Shingles vaccine: declined    ? ?Advanced directives: none  ? ?Conditions/risks identified: none  ? ?Next appointment: none  ? ? ?Preventive Care 36 Years and Older, Female ?Preventive care refers to lifestyle choices and visits with your health care provider that can promote health and wellness. ?What does preventive care include? ?A yearly physical exam. This is also called an annual well check. ?Dental exams once or twice a year. ?Routine eye exams. Ask your health care provider how often you should have your eyes checked. ?Personal lifestyle choices, including: ?Daily care of your teeth and gums. ?Regular physical activity. ?Eating a healthy diet. ?Avoiding tobacco and drug use. ?Limiting alcohol use. ?Practicing safe sex. ?Taking low-dose aspirin every day. ?Taking vitamin and mineral supplements as recommended by your health care provider. ?What happens during an annual well check? ?The services and screenings done by your health care provider during your annual well check will depend on your age, overall health, lifestyle risk factors, and family history of disease. ?Counseling  ?Your health care provider may ask you questions about your: ?Alcohol use. ?Tobacco use. ?Drug use. ?Emotional well-being. ?Home and relationship well-being. ?Sexual activity. ?Eating habits. ?History of falls. ?Memory  and ability to understand (cognition). ?Work and work Statistician. ?Reproductive health. ?Screening  ?You may have the following tests or measurements: ?Height, weight, and BMI. ?Blood pressure. ?Lipid and cholesterol levels. These may be checked every 5 years, or more frequently if you are over 80 years old. ?Skin check. ?Lung cancer screening. You may have this screening every year starting at age 22 if you have a 30-pack-year history of smoking and currently smoke or have quit within the past 15 years. ?Fecal occult blood test (FOBT) of the stool. You may have this test every year starting at age 75. ?Flexible sigmoidoscopy or colonoscopy. You may have a sigmoidoscopy every 5 years or a colonoscopy every 10 years starting at age 72. ?Hepatitis C blood test. ?Hepatitis B blood test. ?Sexually transmitted disease (STD) testing. ?Diabetes screening. This is done by checking your blood sugar (glucose) after you have not eaten for a while (fasting). You may have this done every 1-3 years. ?Bone density scan. This is done to screen for osteoporosis. You may have this done starting at age 53. ?Mammogram. This may be done every 1-2 years. Talk to your health care provider about how often you should have regular mammograms. ?Talk with your health care provider about your test results, treatment options, and if necessary, the need for more tests. ?Vaccines  ?Your health care provider may recommend certain vaccines, such as: ?Influenza vaccine. This is recommended every year. ?Tetanus, diphtheria, and acellular pertussis (Tdap, Td) vaccine. You may need a Td booster every 10 years. ?Zoster vaccine. You may need this after age 93. ?Pneumococcal 13-valent conjugate (PCV13) vaccine. One dose is recommended after age 50. ?Pneumococcal polysaccharide (PPSV23) vaccine. One dose is recommended after age 41. ?Talk to your health care provider about which screenings  and vaccines you need and how often you need them. ?This  information is not intended to replace advice given to you by your health care provider. Make sure you discuss any questions you have with your health care provider. ?Document Released: 01/02/2016 Document Revised: 08/25/2016 Document Reviewed: 10/07/2015 ?Elsevier Interactive Patient Education ? 2017 [employer]. ? ?Fall Prevention in the Home ?Falls can cause injuries. They can happen to people of all ages. There are many things you can do to make your home safe and to help prevent falls. ?What can I do on the outside of my home? ?Regularly fix the edges of walkways and driveways and fix any cracks. ?Remove anything that might make you trip as you walk through a door, such as a raised step or threshold. ?Trim any bushes or trees on the path to your home. ?Use bright outdoor lighting. ?Clear any walking paths of anything that might make someone trip, such as rocks or tools. ?Regularly check to see if handrails are loose or broken. Make sure that both sides of any steps have handrails. ?Any raised decks and porches should have guardrails on the edges. ?Have any leaves, snow, or ice cleared regularly. ?Use sand or salt on walking paths during winter. ?Clean up any spills in your garage right away. This includes oil or grease spills. ?What can I do in the bathroom? ?Use night lights. ?Install grab bars by the toilet and in the tub and shower. Do not use towel bars as grab bars. ?Use non-skid mats or decals in the tub or shower. ?If you need to sit down in the shower, use a plastic, non-slip stool. ?Keep the floor dry. Clean up any water that spills on the floor as soon as it happens. ?Remove soap buildup in the tub or shower regularly. ?Attach bath mats securely with double-sided non-slip rug tape. ?Do not have throw rugs and other things on the floor that can make you trip. ?What can I do in the bedroom? ?Use night lights. ?Make sure that you have a light by your bed that is easy to reach. ?Do not use any sheets or  blankets that are too big for your bed. They should not hang down onto the floor. ?Have a firm chair that has side arms. You can use this for support while you get dressed. ?Do not have throw rugs and other things on the floor that can make you trip. ?What can I do in the kitchen? ?Clean up any spills right away. ?Avoid walking on wet floors. ?Keep items that you use a lot in easy-to-reach places. ?If you need to reach something above you, use a strong step stool that has a grab bar. ?Keep electrical cords out of the way. ?Do not use floor polish or wax that makes floors slippery. If you must use wax, use non-skid floor wax. ?Do not have throw rugs and other things on the floor that can make you trip. ?What can I do with my stairs? ?Do not leave any items on the stairs. ?Make sure that there are handrails on both sides of the stairs and use them. Fix handrails that are broken or loose. Make sure that handrails are as long as the stairways. ?Check any carpeting to make sure that it is firmly attached to the stairs. Fix any carpet that is loose or worn. ?Avoid having throw rugs at the top or bottom of the stairs. If you do have throw rugs, attach them to the floor with carpet tape. ?  Make sure that you have a light switch at the top of the stairs and the bottom of the stairs. If you do not have them, ask someone to add them for you. ?What else can I do to help prevent falls? ?Wear shoes that: ?Do not have high heels. ?Have rubber bottoms. ?Are comfortable and fit you well. ?Are closed at the toe. Do not wear sandals. ?If you use a stepladder: ?Make sure that it is fully opened. Do not climb a closed stepladder. ?Make sure that both sides of the stepladder are locked into place. ?Ask someone to hold it for you, if possible. ?Clearly mark and make sure that you can see: ?Any grab bars or handrails. ?First and last steps. ?Where the edge of each step is. ?Use tools that help you move around (mobility aids) if they are  needed. These include: ?Canes. ?Walkers. ?Scooters. ?Crutches. ?Turn on the lights when you go into a dark area. Replace any light bulbs as soon as they burn out. ?Set up your furniture so you have a clear path. Avo

## 2022-04-16 NOTE — Progress Notes (Signed)
Duplicate MWV ,  PCP decided to completed [place] on 04/15/2022 ? ?Erroneous encounter  ?

## 2022-04-20 ENCOUNTER — Ambulatory Visit
Admission: RE | Admit: 2022-04-20 | Discharge: 2022-04-20 | Disposition: A | Discharge status: Home / Self Care | Payer: Medicare Other | Source: Ambulatory Visit | Attending: Internal Medicine | Admitting: Internal Medicine

## 2022-04-20 DIAGNOSIS — R1031 Right lower quadrant pain: Secondary | ICD-10-CM

## 2022-04-21 ENCOUNTER — Encounter: Payer: Self-pay | Admitting: Gastroenterology

## 2022-04-21 ENCOUNTER — Other Ambulatory Visit: Payer: Self-pay | Admitting: Internal Medicine

## 2022-04-21 DIAGNOSIS — R634 Abnormal weight loss: Secondary | ICD-10-CM

## 2022-04-21 DIAGNOSIS — D649 Anemia, unspecified: Secondary | ICD-10-CM

## 2022-04-21 DIAGNOSIS — R933 Abnormal findings on diagnostic imaging of other parts of digestive tract: Secondary | ICD-10-CM

## 2022-04-22 ENCOUNTER — Telehealth: Payer: Self-pay | Admitting: Pharmacy Technician

## 2022-04-22 NOTE — Telephone Encounter (Signed)
error 

## 2022-04-23 ENCOUNTER — Other Ambulatory Visit: Payer: Self-pay | Admitting: Internal Medicine

## 2022-04-29 ENCOUNTER — Ambulatory Visit (INDEPENDENT_AMBULATORY_CARE_PROVIDER_SITE_OTHER): Payer: Medicare Other

## 2022-04-29 VITALS — BP 131/69 | HR 82 | Temp 98.3°F | Resp 18 | Ht 63.0 in | Wt 111.2 lb

## 2022-04-29 DIAGNOSIS — D500 Iron deficiency anemia secondary to blood loss (chronic): Secondary | ICD-10-CM | POA: Diagnosis not present

## 2022-04-29 MED ORDER — SODIUM CHLORIDE 0.9 % IV SOLN
510.0000 mg | Freq: Once | INTRAVENOUS | Status: AC
  Administered 2022-04-29: 510 mg via INTRAVENOUS
  Filled 2022-04-29: qty 17

## 2022-04-29 NOTE — Progress Notes (Signed)
Diagnosis: Iron Deficiency Anemia ? ?Provider:  Marshell Garfinkel, MD ? ?Procedure: Infusion ? ?IV Type: Peripheral, IV Location: L Antecubital ? ?Feraheme (Ferumoxytol), Dose: 510 mg ? ?Infusion Start Time: 1016 ? ?Infusion Stop Time: 1504 ? ?Post Infusion IV Care: Observation period completed and Peripheral IV Discontinued ? ?Discharge: Condition: Good, Destination: Home . AVS provided to patient.  ? ?Performed by:  Koren Shiver, RN  ?  ?

## 2022-05-06 ENCOUNTER — Ambulatory Visit (INDEPENDENT_AMBULATORY_CARE_PROVIDER_SITE_OTHER): Payer: Medicare Other

## 2022-05-06 VITALS — BP 145/75 | HR 73 | Temp 98.4°F | Resp 16 | Ht 63.0 in | Wt 111.2 lb

## 2022-05-06 DIAGNOSIS — D500 Iron deficiency anemia secondary to blood loss (chronic): Secondary | ICD-10-CM | POA: Diagnosis not present

## 2022-05-06 MED ORDER — SODIUM CHLORIDE 0.9 % IV SOLN
510.0000 mg | Freq: Once | INTRAVENOUS | Status: AC
  Administered 2022-05-06: 510 mg via INTRAVENOUS
  Filled 2022-05-06: qty 17

## 2022-05-06 NOTE — Progress Notes (Signed)
Diagnosis: Iron Deficiency Anemia  Provider:  Marshell Garfinkel, MD  Procedure: Infusion  IV Type: Peripheral, IV Location: R Antecubital  Feraheme (Ferumoxytol), Dose: 510 mg  Infusion Start Time: 1004  Infusion Stop Time: 3353  Post Infusion IV Care: Peripheral IV Discontinued  Discharge: Condition: Good, Destination: Home . AVS provided to patient.   Performed by:  Adelina Mings, LPN

## 2022-05-11 ENCOUNTER — Encounter: Payer: Self-pay | Admitting: Gastroenterology

## 2022-05-11 ENCOUNTER — Ambulatory Visit (INDEPENDENT_AMBULATORY_CARE_PROVIDER_SITE_OTHER): Payer: Medicare Other | Admitting: Gastroenterology

## 2022-05-11 ENCOUNTER — Other Ambulatory Visit (INDEPENDENT_AMBULATORY_CARE_PROVIDER_SITE_OTHER): Payer: Medicare Other

## 2022-05-11 VITALS — BP 140/80 | HR 94 | Ht 63.0 in | Wt 110.0 lb

## 2022-05-11 DIAGNOSIS — D500 Iron deficiency anemia secondary to blood loss (chronic): Secondary | ICD-10-CM

## 2022-05-11 DIAGNOSIS — R1031 Right lower quadrant pain: Secondary | ICD-10-CM

## 2022-05-11 DIAGNOSIS — C183 Malignant neoplasm of hepatic flexure: Secondary | ICD-10-CM | POA: Diagnosis not present

## 2022-05-11 DIAGNOSIS — R933 Abnormal findings on diagnostic imaging of other parts of digestive tract: Secondary | ICD-10-CM

## 2022-05-11 LAB — CBC WITH DIFFERENTIAL/PLATELET
Basophils Absolute: 0.1 10*3/uL (ref 0.0–0.1)
Basophils Relative: 1 % (ref 0.0–3.0)
Eosinophils Absolute: 0.2 10*3/uL (ref 0.0–0.7)
Eosinophils Relative: 1.9 % (ref 0.0–5.0)
HCT: 30.7 % — ABNORMAL LOW (ref 36.0–46.0)
Hemoglobin: 9.8 g/dL — ABNORMAL LOW (ref 12.0–15.0)
Lymphocytes Relative: 10.7 % — ABNORMAL LOW (ref 12.0–46.0)
Lymphs Abs: 1 10*3/uL (ref 0.7–4.0)
MCHC: 32 g/dL (ref 30.0–36.0)
MCV: 73.6 fl — ABNORMAL LOW (ref 78.0–100.0)
Monocytes Absolute: 0.9 10*3/uL (ref 0.1–1.0)
Monocytes Relative: 9.7 % (ref 3.0–12.0)
Neutro Abs: 7.2 10*3/uL (ref 1.4–7.7)
Neutrophils Relative %: 76.7 % (ref 43.0–77.0)
Platelets: 360 10*3/uL (ref 150.0–400.0)
RBC: 4.17 Mil/uL (ref 3.87–5.11)
RDW: 29.4 % — ABNORMAL HIGH (ref 11.5–15.5)
WBC: 9.3 10*3/uL (ref 4.0–10.5)

## 2022-05-11 LAB — IBC + FERRITIN
Ferritin: 451.6 ng/mL — ABNORMAL HIGH (ref 10.0–291.0)
Iron: 37 ug/dL — ABNORMAL LOW (ref 42–145)
Saturation Ratios: 11.7 % — ABNORMAL LOW (ref 20.0–50.0)
TIBC: 315 ug/dL (ref 250.0–450.0)
Transferrin: 225 mg/dL (ref 212.0–360.0)

## 2022-05-11 MED ORDER — NA SULFATE-K SULFATE-MG SULF 17.5-3.13-1.6 GM/177ML PO SOLN: 1.0000 | Freq: Once | ORAL | 0 refills | Status: AC

## 2022-05-11 NOTE — Progress Notes (Signed)
[facility] Consult Note:  History: Ann [name] 05/11/2022  Referring provider: Hoyt Koch, MD  Reason for consult/chief complaint: Anemia (Pt gets iron infusions and takes oral iron) and abnormal ct (Abnormal ct; also reports 25 lb weight loss over the course of a year)   Subjective  HPI: Ann [name] was seen on 05/15/2022 by Dr. Sharlet Salina for annual Medicare wellness appointment.  She reported right lower quadrant abdominal pain and about a 15 pound (10%) weight loss and was found to have marked anemia with a hemoglobin of 8.0 CTAP report (see below) -no IV contrast, hepatic flexure findings worrisome for malignancy.  She then had Feraheme infusions on May 11 and May 18.  Ann [name] is a pleasant 87 year old woman who says she finally got a primary care provider for the first time about a year ago because she had been healthy her entire life. She then began noticing weight loss as noted above, and estimates it has been about 25 pounds since then.  She had been attributing it to only eating 2 meals per day on average.  Her bowel habits are regular and she has had some right lower quadrant pain intermittently noticeable after meals.  She denies dysphagia, odynophagia, nausea vomiting or early satiety.  Her energy is improved after recent iron infusions. ROS:  Review of Systems  Constitutional:  Negative for appetite change and unexpected weight change.  HENT:  Negative for mouth sores and voice change.   Eyes:  Negative for pain and redness.  Respiratory:  Negative for cough and shortness of breath.   Cardiovascular:  Negative for chest pain and palpitations.  Genitourinary:  Negative for dysuria and hematuria.  Musculoskeletal:  Negative for arthralgias and myalgias.  Skin:  Negative for pallor and rash.  Neurological:  Negative for weakness and headaches.  Hematological:  Negative for adenopathy.    Past Medical History: Past Medical History:  Diagnosis  Date   Anemia    Chicken pox      Past Surgical History: History reviewed. No pertinent surgical history.   Family History: Family History  Problem Relation Age of Onset   Cancer Mother    Diabetes Mother    Early death Mother    Arthritis Father    Cancer Sister    Heart disease Brother    Heart attack Brother    Diabetes Sister    Stroke Sister     Social History: Social History   Socioeconomic History   Marital status: Widowed    Spouse name: Not on file   Number of children: Not on file   Years of education: Not on file   Highest education level: Not on file  Occupational History   Not on file  Tobacco Use   Smoking status: Never   Smokeless tobacco: Never  Vaping Use   Vaping Use: Never used  Substance and Sexual Activity   Alcohol use: Yes   Drug use: Never   Sexual activity: Not Currently    Partners: Male  Other Topics Concern   Not on file  Social History Narrative   Not on file   Social Determinants of Health   Financial Resource Strain: Low Risk    Difficulty of Paying Living Expenses: Not hard at all  Food Insecurity: No Food Insecurity   Worried About Running Out of Food in the Last Year: Never true   [place] in the Last Year: Never true  Transportation Needs: No Transportation Needs   Lack  of Transportation (Medical): No   Lack of Transportation (Non-Medical): No  Physical Activity: Insufficiently Active   Days of Exercise per Week: 3 days   Minutes of Exercise per Session: 30 min  Stress: No Stress Concern Present   Feeling of Stress : Not at all  Social Connections: Moderately Integrated   Frequency of Communication with Friends and Family: Twice a week   Frequency of Social Gatherings with Friends and Family: Twice a week   Attends Religious Services: More than 4 times per year   Active Member of [employer] or Organizations: Yes   Attends Archivist Meetings: 1 to 4 times per year   Marital Status: Widowed     Allergies: No Known Allergies  Outpatient Meds: Current Outpatient Medications  Medication Sig Dispense Refill   B Complex Vitamins (B COMPLEX 1 PO) Take 1 tablet by mouth daily.     Biotin w/ Vitamins C & E (HAIR/SKIN/NAILS PO) Take 1 tablet by mouth daily.     calcium carbonate (OSCAL) 1500 (600 Ca) MG TABS tablet Take 600 mg of elemental calcium by mouth daily with breakfast.     ferrous sulfate 325 (65 FE) MG tablet Take 325 mg by mouth daily with breakfast.     magnesium 30 MG tablet Take 30 mg by mouth 2 (two) times daily.     No current facility-administered medications for this visit.      ___________________________________________________________________ Objective   Exam:  BP 140/80   Pulse 94   Ht '5\' 3"'$  (1.6 m)   Wt 110 lb (49.9 kg)   BMI 19.49 kg/m  Wt Readings from Last 3 Encounters:  05/11/22 110 lb (49.9 kg)  05/06/22 111 lb 3.2 oz (50.4 kg)  04/29/22 111 lb 3.2 oz (50.4 kg)   Petite and thin General: Well-appearing, good spirits, gets on exam table without assistance Eyes: sclera anicteric, no redness ENT: oral mucosa moist without lesions, no cervical or supraclavicular lymphadenopathy CV: RRR without murmur, S1/S2, no JVD, no peripheral edema Resp: clear to auscultation bilaterally, normal RR and effort noted GI: soft, RLQ fullness without tenderness, with active bowel sounds. No guarding or palpable organomegaly noted. Skin; warm and dry, no rash or jaundice noted Neuro: awake, alert and oriented x 3. Normal gross motor function and fluent speech  Labs:     Latest Ref Rng & Units 04/15/2022   10:23 AM 03/27/2021    9:36 AM  CBC  WBC 4.0 - 10.5 K/uL 8.1   7.7    Hemoglobin 12.0 - 15.0 g/dL 8.0 Repeated and verified X2.   10.1    Hematocrit 36.0 - 46.0 % 25.4   31.8    Platelets 150.0 - 400.0 K/uL 384.0   298.0     MCV 66 (was 73 in April 2022)     Latest Ref Rng & Units 04/15/2022   10:23 AM 03/27/2021    9:36 AM  CMP  Glucose 70 - 99  mg/dL 127   95    BUN 6 - 23 mg/dL 17   14    Creatinine 0.40 - 1.20 mg/dL 1.00   1.01    Sodium 135 - 145 mEq/L 136   136    Potassium 3.5 - 5.1 mEq/L 3.9   3.7    Chloride 96 - 112 mEq/L 103   104    CO2 19 - 32 mEq/L 23   24    Calcium 8.4 - 10.5 mg/dL 8.9   9.2  Total Protein 6.0 - 8.3 g/dL 6.8   7.1    Total Bilirubin 0.2 - 1.2 mg/dL 0.4   0.4    Alkaline Phos 39 - 117 U/L 72   77    AST 0 - 37 U/L 14   14    ALT 0 - 35 U/L 9   8       Radiologic Studies:  CLINICAL DATA:  Right lower quadrant abdominal pain.   EXAM: CT ABDOMEN AND PELVIS WITHOUT CONTRAST   TECHNIQUE: Multidetector CT imaging of the abdomen and pelvis was performed following the standard protocol without IV contrast.   RADIATION DOSE REDUCTION: This exam was performed according to the departmental dose-optimization program which includes automated exposure control, adjustment of the mA and/or kV according to patient size and/or use of iterative reconstruction technique.   COMPARISON:  None Available.   FINDINGS: Evaluation of this exam is limited in the absence of intravenous contrast.   Lower chest: Left lung base linear atelectasis/scarring. The visualized lung bases are otherwise clear.   No intra-abdominal free air or free fluid.   Hepatobiliary: The liver is unremarkable. No intrahepatic biliary ductal dilatation. Probable small gallstones versus less likely foci of calcification in the gallbladder wall. No pericholecystic fluid or evidence of acute cholecystitis by CT.   Pancreas: Unremarkable. No pancreatic ductal dilatation or surrounding inflammatory changes.   Spleen: Normal in size without focal abnormality.   Adrenals/Urinary Tract: The adrenal glands are unremarkable. Mild bilateral hydronephrosis versus small parapelvic cyst. No calcified stone identified. The visualized ureters and the urinary bladder appear unremarkable.   Stomach/Bowel: There is wall thickening of the  hepatic flexure of the colon with adjacent stranding. There is associated luminal narrowing. There is apparent shouldering of the inflamed and thickened segment. Although this may represent colitis, findings concerning for a neoplastic process. Further evaluation with colonoscopy after resolution of inflammatory changes recommended. There is no bowel obstruction. The appendix is normal.   Vascular/Lymphatic: Mild aortoiliac atherosclerotic disease. The IVC is grossly unremarkable. No portal venous gas. There is no adenopathy.   Reproductive: The uterus and ovaries are grossly unremarkable. No adnexal masses.   Other: None   Musculoskeletal: Osteopenia with degenerative changes of spine. No acute osseous pathology.   IMPRESSION: 1. Findings highly concerning for malignancy of the hepatic flexure of the colon with possible superimposed colitis. Correlation with clinical exam and follow-up with colonoscopy after resolution of acute inflammation recommended. 2. No bowel obstruction. Normal appendix. 3. Probable small gallstones versus less likely foci of calcification in the gallbladder wall. No evidence of acute cholecystitis by CT. 4. Mild bilateral hydronephrosis versus small parapelvic cyst. 5. Aortic Atherosclerosis (ICD10-I70.0).     Electronically Signed   By: Anner Crete M.D.   On: 04/20/2022 18:28  (CT images personally reviewed - HD)  Assessment: Encounter Diagnoses  Name Primary?   Iron deficiency anemia due to chronic blood loss Yes   RLQ abdominal pain    Abnormal finding on GI tract imaging     This overall clinical scenario is worrisome for colonic adenocarcinoma.  I told her my concerns and the need for colonoscopy and she was agreeable after thorough discussion of procedure and risks. She also understands that if a cancerous mass is discovered, she will then need a surgical consult.  Plan: Colonoscopy scheduled for next week.  She anticipated this  after discussion with her physician, and says she has a friend or family member who is willing to bring her.  The benefits and risks of the planned procedure were described in detail with the patient or (when appropriate) their health care proxy.  Risks were outlined as including, but not limited to, bleeding, infection, perforation, adverse medication reaction leading to cardiac or pulmonary decompensation, pancreatitis (if ERCP).  The limitation of incomplete mucosal visualization was also discussed.  No guarantees or warranties were given.  CBC, iron studies and CEA today   Thank you for the courtesy of this consult.  Please call me with any questions or concerns.  Nelida Meuse III  CC: Referring provider noted above

## 2022-05-11 NOTE — Patient Instructions (Signed)
If you are age 85 or older, your body mass index should be between 23-30. Your Body mass index is 19.49 kg/m. If this is out of the aforementioned range listed, please consider follow up with your Primary Care Provider.  If you are age 79 or younger, your body mass index should be between 19-25. Your Body mass index is 19.49 kg/m. If this is out of the aformentioned range listed, please consider follow up with your Primary Care Provider.   ________________________________________________________  The [facility] providers would like to encourage you to use [facility] to communicate with providers for non-urgent requests or questions.  Due to long hold times on the telephone, sending your provider a message by [facility] may be a faster and more efficient way to get a response.  Please allow 48 business hours for a response.  Please remember that this is for non-urgent requests.  _______________________________________________________  Ann [name] have been scheduled for a colonoscopy. Please follow written instructions given to you at your visit today.  Please pick up your prep supplies at the pharmacy within the next 1-3 days. If you use inhalers (even only as needed), please bring them with you on the day of your procedure.  Due to recent changes in healthcare laws, you may see the results of your imaging and laboratory studies on MyChart before your provider has had a chance to review them.  We understand that in some cases there may be results that are confusing or concerning to you. Not all laboratory results come back in the same time frame and the provider may be waiting for multiple results in order to interpret others.  Please give [place] 48 hours in order for your provider to thoroughly review all the results before contacting the office for clarification of your results.   It was a pleasure to see you today!  Thank you for trusting me with your gastrointestinal care!

## 2022-05-12 LAB — CEA: CEA: 6.3 ng/mL — ABNORMAL HIGH

## 2022-05-18 ENCOUNTER — Encounter: Payer: Self-pay | Admitting: Gastroenterology

## 2022-05-18 ENCOUNTER — Ambulatory Visit (AMBULATORY_SURGERY_CENTER): Payer: Medicare Other | Admitting: Gastroenterology

## 2022-05-18 VITALS — BP 138/73 | HR 79 | Temp 98.4°F | Resp 16 | Ht 63.0 in | Wt 110.0 lb

## 2022-05-18 DIAGNOSIS — K6389 Other specified diseases of intestine: Secondary | ICD-10-CM

## 2022-05-18 DIAGNOSIS — C184 Malignant neoplasm of transverse colon: Secondary | ICD-10-CM | POA: Diagnosis not present

## 2022-05-18 DIAGNOSIS — R933 Abnormal findings on diagnostic imaging of other parts of digestive tract: Secondary | ICD-10-CM | POA: Diagnosis not present

## 2022-05-18 DIAGNOSIS — D500 Iron deficiency anemia secondary to blood loss (chronic): Secondary | ICD-10-CM | POA: Diagnosis not present

## 2022-05-18 DIAGNOSIS — D128 Benign neoplasm of rectum: Secondary | ICD-10-CM | POA: Diagnosis not present

## 2022-05-18 MED ORDER — SODIUM CHLORIDE 0.9 % IV SOLN: 500.0000 mL | INTRAVENOUS | Status: DC

## 2022-05-18 NOTE — Progress Notes (Signed)
Called to room to assist during endoscopic procedure.  Patient ID and intended procedure confirmed with present staff. Received instructions for my participation in the procedure from the performing physician.  

## 2022-05-18 NOTE — Progress Notes (Unsigned)
No changes to clinical history since GI office visit on 05/11/22. Hemoglobin 9.8 that day The patient is appropriate for an endoscopic procedure in the ambulatory setting.  - Wilfrid Lund, MD

## 2022-05-18 NOTE — Op Note (Signed)
[facility] Patient Name: Eshaal Duby Procedure Date: 05/18/2022 4:34 PM MRN: [id number] Endoscopist: Mallie Mussel L. Loletha Carrow , MD Age: 87 Referring MD:  Date of Birth: August 07, 1935 Gender: Female Account #: [id number] Procedure:                Colonoscopy Indications:              Iron deficiency anemia secondary to chronic blood                            loss, , Abnormal CT of the GI tract (hepatic                            flexure mass on recent scan)                           CEA elevated at 6.3 Medicines:                Monitored Anesthesia Care Procedure:                Pre-Anesthesia Assessment:                           - Prior to the procedure, a History and Physical                            was performed, and patient medications and                            allergies were reviewed. The patient's tolerance of                            previous anesthesia was also reviewed. The risks                            and benefits of the procedure and the sedation                            options and risks were discussed with the patient.                            All questions were answered, and informed consent                            was obtained. Prior Anticoagulants: The patient has                            taken no previous anticoagulant or antiplatelet                            agents. ASA Grade Assessment: II - A patient with                            mild systemic disease. After reviewing the risks  and benefits, the patient was deemed in                            satisfactory condition to undergo the procedure.                           After obtaining informed consent, the colonoscope                            was passed under direct vision. Throughout the                            procedure, the patient's blood pressure, pulse, and                            oxygen saturations were monitored continuously. The                             Olympus PCF-H190DL (#[id number]) Colonoscope was                            introduced through the anus and advanced to the the                            transverse colon. The colonoscopy was performed                            without difficulty. The patient tolerated the                            procedure well. The quality of the bowel                            preparation was good. The transverse colon (extent                            of insertion) and rectum were photographed. Scope In: 4:39:45 PM Scope Out: 5:01:04 PM Scope Withdrawal Time: 0 hours 15 minutes 4 seconds  Total Procedure Duration: 0 hours 21 minutes 19 seconds  Findings:                 The perianal and digital rectal examinations were                            normal.                           A fungating, infiltrative and ulcerated partially                            obstructing mass was found in the transverse colon.                            The mass was circumferential and precluded passage  of the pediatric colonoscope. Biopsies were taken                            with a cold forceps for histology. Area was                            tattooed with an injection of 1 mL of Spot - two                            0.71m injections on opposite walls                           Three sessile polyps were found in the transverse                            colon. The polyps were 8 to 12 mm in size. They are                            within 5-7 of the distal extent of the mass. Not                            removed - tattoos placed distal to the polyps.                           A few small-mouthed diverticula were found in the                            sigmoid colon.                           A 6 mm polyp was found in the rectum. The polyp was                            sessile. The polyp was removed with a cold snare.                            Resection and retrieval were  complete.                           Internal hemorrhoids were found.                           The exam was otherwise without abnormality on                            direct and retroflexion views. Complications:            No immediate complications. Estimated Blood Loss:     Estimated blood loss was minimal. 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  Biopsied. Tattooed.                           - Three 8 to 12 mm polyps in the transverse colon.                           - Diverticulosis in the sigmoid colon.                           - One 6 mm polyp in the rectum, removed with a cold                            snare. Resected and retrieved.                           - Internal hemorrhoids.                           - The examination was otherwise normal on direct                            and retroflexion views. Recommendation:           - Patient has a contact number available for                            emergencies. The signs and symptoms of potential                            delayed complications were discussed with the                            patient. Return to normal activities tomorrow.                            Written discharge instructions were provided to the                            patient.                           - Resume previous diet.                           - Continue present medications.                           - Await pathology results.                           - Refer to a colo-rectal surgeon at appointment to                            be scheduled. Marl Seago L. Loletha Carrow, MD 05/18/2022 5:11:49 PM This report has been signed electronically.

## 2022-05-18 NOTE — Progress Notes (Signed)
To pacu, VSS. Report to Rn,tb 

## 2022-05-18 NOTE — Patient Instructions (Addendum)
Handout provided on polyps.   Refer to a colo-rectal surgeon at appointment to be scheduled.  YOU HAD AN ENDOSCOPIC PROCEDURE TODAY AT [facility]:   Refer to the procedure report that was given to you for any specific questions about what was found during the examination.  If the procedure report does not answer your questions, please call your gastroenterologist to clarify.  If you requested that your care partner not be given the details of your procedure findings, then the procedure report has been included in a sealed envelope for you to review at your convenience later.  YOU SHOULD EXPECT: Some feelings of bloating in the abdomen. Passage of more gas than usual.  Walking can help get rid of the air that was put into your GI tract during the procedure and reduce the bloating. If you had a lower endoscopy (such as a colonoscopy or flexible sigmoidoscopy) you may notice spotting of blood in your stool or on the toilet paper. If you underwent a bowel prep for your procedure, you may not have a normal bowel movement for a few days.  Please Note:  You might notice some irritation and congestion in your nose or some drainage.  This is from the oxygen used during your procedure.  There is no need for concern and it should clear up in a day or so.  SYMPTOMS TO REPORT IMMEDIATELY:  Following lower endoscopy (colonoscopy or flexible sigmoidoscopy):  Excessive amounts of blood in the stool  Significant tenderness or worsening of abdominal pains  Swelling of the abdomen that is new, acute  Fever of 100F or higher  For urgent or emergent issues, a gastroenterologist can be reached at any hour by calling [phone]. Do not use MyChart messaging for urgent concerns.    DIET:  We do recommend a small meal at first, but then you may proceed to your regular diet.  Drink plenty of fluids but you should avoid alcoholic beverages for 24 hours.  ACTIVITY:  You should plan to take it easy  for the rest of today and you should NOT DRIVE or use heavy machinery until tomorrow (because of the sedation medicines used during the test).    FOLLOW UP: Our staff will call the number listed on your records 48-72 hours following your procedure to check on you and address any questions or concerns that you may have regarding the information given to you following your procedure. If we do not reach you, we will leave a message.  We will attempt to reach you two times.  During this call, we will ask if you have developed any symptoms of COVID 19. If you develop any symptoms (ie: fever, flu-like symptoms, shortness of breath, cough etc.) before then, please call [phone].  If you test positive for Covid 19 in the 2 weeks post procedure, please call and report this information to [place].    If any biopsies were taken you will be contacted by phone or by letter within the next 1-3 weeks.  Please call us at [phone] if you have not heard about the biopsies in 3 weeks.    SIGNATURES/CONFIDENTIALITY: You and/or your care partner have signed paperwork which will be entered into your electronic medical record.  These signatures attest to the fact that that the information above on your After Visit Summary has been reviewed and is understood.  Full responsibility of the confidentiality of this discharge information lies with you and/or your care-partner.

## 2022-05-19 ENCOUNTER — Telehealth: Payer: Self-pay

## 2022-05-19 NOTE — Telephone Encounter (Signed)
  Follow up Call-     05/18/2022    3:36 PM  Call back number  Post procedure Call Back phone  # [phone]  Permission to leave phone message Yes     Patient questions:  Do you have a fever, pain , or abdominal swelling? No. Pain Score  0 *  Have you tolerated food without any problems? Yes.    Have you been able to return to your normal activities? Yes.    Do you have any questions about your discharge instructions: Diet   No. Medications  No. Follow up visit  No.  Do you have questions or concerns about your Care? No.  Actions: * If pain score is 4 or above: No action needed, pain <4.

## 2022-05-19 NOTE — Telephone Encounter (Signed)
Per 05/18/22 procedure report - Refer to a colo-rectal surgeon at appt to be scheduled.   Referral, records, pt's demographic and insurance information faxed to [facility] at [phone].

## 2022-06-01 ENCOUNTER — Encounter: Payer: Self-pay | Admitting: Surgery

## 2022-06-01 ENCOUNTER — Ambulatory Visit: Payer: Self-pay | Admitting: Surgery

## 2022-06-01 DIAGNOSIS — D500 Iron deficiency anemia secondary to blood loss (chronic): Secondary | ICD-10-CM | POA: Insufficient documentation

## 2022-06-01 DIAGNOSIS — D509 Iron deficiency anemia, unspecified: Secondary | ICD-10-CM | POA: Insufficient documentation

## 2022-06-01 DIAGNOSIS — C183 Malignant neoplasm of hepatic flexure: Secondary | ICD-10-CM | POA: Insufficient documentation

## 2022-06-01 DIAGNOSIS — R739 Hyperglycemia, unspecified: Secondary | ICD-10-CM

## 2022-06-07 NOTE — Progress Notes (Addendum)
PCP - Pricilla Holm, MD LOV 04-15-22 epic Cardiologist - no  PPM/ICD -  Device Orders -  Rep Notified -   Chest x-ray -  EKG -  Stress Test -  ECHO -  Cardiac Cath -   Sleep Study -  CPAP -   Fasting Blood Sugar -  Checks Blood Sugar _____ times a day  Blood Thinner Instructions: Aspirin Instructions:  ERAS Protcol - PRE-SURGERY Ensure x3   COVID vaccine -x3 Moderna  Activity--Able to walk a flight of stairs without SOB or Cp Anesthesia review: anemia, Hgb 9.8  Patient denies shortness of breath, fever, cough and chest pain at PAT appointment   All instructions explained to the patient, with a verbal understanding of the material. Patient agrees to go over the instructions while at home for a better understanding. Patient also instructed to self quarantine after being tested for COVID-19. The opportunity to ask questions was provided.

## 2022-06-07 NOTE — Patient Instructions (Signed)
DUE TO COVID-19 ONLY TWO VISITORS  (aged 85 and older)  ARE ALLOWED TO COME WITH YOU AND STAY IN THE WAITING ROOM ONLY DURING PRE OP AND PROCEDURE.   **NO VISITORS ARE ALLOWED IN THE SHORT STAY AREA OR RECOVERY ROOM!!**  IF YOU WILL BE ADMITTED INTO THE HOSPITAL YOU ARE ALLOWED ONLY FOUR SUPPORT PEOPLE DURING VISITATION HOURS ONLY (7 AM -8PM)   The support person(s) must pass our screening, gel in and out, and wear a mask at all times, including in the patient's room. Patients must also wear a mask when staff or their support person are in the room. Visitors GUEST BADGE MUST BE WORN VISIBLY  One adult visitor may remain with you overnight and MUST be in the room by 8 P.M.     Your procedure is scheduled on: 06-17-22   Report to [facility] Main Entrance    Report to admitting at      1115 AM   Call this number if you have problems the morning of surgery [phone]  FOLLOW A CLEAR LIQUID DIET THE DAY OF YOUR BOWEL PREP TO PREVENT DEHYDRATION. FOLLOW BOWEL PREP PER MD ORDER   You may have the following liquids until __1030 ____ AM DAY OF SURGERY  then nothing by mouth  Water Black Coffee (sugar ok, NO MILK/CREAM OR CREAMERS)  Tea (sugar ok, NO MILK/CREAM OR CREAMERS) regular and decaf                             Plain Jell-O (NO RED)                                           Fruit ices (not with fruit pulp, NO RED)                                     Popsicles (NO RED)                                                                  Juice: apple, WHITE grape, WHITE cranberry Sports drinks like Gatorade (NO RED) Clear broth(vegetable,chicken,beef)               Drink 2 Ensure/G2 drinks AT 10:00 PM the night before surgery.        The day of surgery:  Drink ONE (1) Pre-Surgery Clear Ensure 1015 AM the morning of surgery. Drink in one sitting. Do not sip.  This drink was given to you during your hospital  pre-op appointment visit. Nothing else to drink after completing  the  Pre-Surgery Clear Ensure           If you have questions, please contact your surgeon's office.   FOLLOW BOWEL PREP AND ANY ADDITIONAL PRE OP INSTRUCTIONS YOU RECEIVED FROM YOUR SURGEON'S OFFICE!!!     Oral Hygiene is also important to reduce your risk of infection.  Remember - BRUSH YOUR TEETH THE MORNING OF SURGERY WITH YOUR REGULAR TOOTHPASTE   Do NOT smoke after Midnight   Take these medicines the morning of surgery with A SIP OF WATER: NONE  DO NOT TAKE ANY ORAL DIABETIC MEDICATIONS DAY OF YOUR SURGERY  .                              You may not have any metal on your body including hair pins, jewelry, and body piercing             Do not wear make-up, lotions, powders, perfumes/cologne, or deodorant  Do not wear nail polish including gel and S&S, artificial/acrylic nails, or any other type of covering on natural nails including finger and toenails. If you have artificial nails, gel coating, etc. that needs to be removed by a nail salon please have this removed prior to surgery or surgery may need to be canceled/ delayed if the surgeon/ anesthesia feels like they are unable to be safely monitored.   Do not shave  48 hours prior to surgery.               Do not bring valuables to the hospital. [place].   Contacts, dentures or bridgework may not be worn into surgery.   Bring small overnight bag day of surgery.   DO NOT [place]. PHARMACY WILL DISPENSE MEDICATIONS LISTED ON YOUR MEDICATION LIST TO YOU DURING YOUR ADMISSION [place]!    Patients discharged on the day of surgery will not be allowed to drive home.  Someone NEEDS to stay with you for the first 24 hours after anesthesia.                Please read over the following fact sheets you were given: IF YOU HAVE QUESTIONS ABOUT YOUR PRE-OP INSTRUCTIONS PLEASE CALL [phone] [facility]  Health - Preparing for Surgery Before surgery, you can play an important role.  Because skin is not sterile, your skin needs to be as free of germs as possible.  You can reduce the number of germs on your skin by washing with CHG (chlorahexidine gluconate) soap before surgery.  CHG is an antiseptic cleaner which kills germs and bonds with the skin to continue killing germs even after washing. Please DO NOT use if you have an allergy to CHG or antibacterial soaps.  If your skin becomes reddened/irritated stop using the CHG and inform your nurse when you arrive at Short Stay. Do not shave (including legs and underarms) for at least 48 hours prior to the first CHG shower.  You may shave your face/neck. Please follow these instructions carefully:  1.  Shower with CHG Soap the night before surgery and the  morning of Surgery.  2.  If you choose to wash your hair, wash your hair first as usual with your  normal  shampoo.  3.  After you shampoo, rinse your hair and body thoroughly to remove the  shampoo.                           4.  Use CHG as you would any other liquid soap.  You can apply chg directly  to the skin and wash  Gently with a scrungie or clean washcloth.  5.  Apply the CHG Soap to your body ONLY FROM THE NECK DOWN.   Do not use on face/ open                           Wound or open sores. Avoid contact with eyes, ears mouth and genitals (private parts).                       Wash face,  Genitals (private parts) with your normal soap.             6.  Wash thoroughly, paying special attention to the area where your surgery  will be performed.  7.  Thoroughly rinse your body with warm water from the neck down.  8.  DO NOT shower/wash with your normal soap after using and rinsing off  the CHG Soap.                9.  Pat yourself dry with a clean towel.            10.  Wear clean pajamas.            11.  Place clean sheets on your bed the night of your first shower and do not   sleep with pets. Day of Surgery : Do not apply any lotions/deodorants the morning of surgery.  Please wear clean clothes to the hospital/surgery center.  FAILURE TO FOLLOW THESE INSTRUCTIONS MAY RESULT IN THE CANCELLATION OF YOUR SURGERY PATIENT SIGNATURE_________________________________  NURSE SIGNATURE__________________________________  ________________________________________________________________________   Ann [name]  An incentive spirometer is a tool that can help keep your lungs clear and active. This tool measures how well you are filling your lungs with each breath. Taking long deep breaths may help reverse or decrease the chance of developing breathing (pulmonary) problems (especially infection) following: A long period of time when you are unable to move or be active. BEFORE THE PROCEDURE  If the spirometer includes an indicator to show your best effort, your nurse or respiratory therapist will set it to a desired goal. If possible, sit up straight or lean slightly forward. Try not to slouch. Hold the incentive spirometer in an upright position. INSTRUCTIONS FOR USE  Sit on the edge of your bed if possible, or sit up as far as you can in bed or on a chair. Hold the incentive spirometer in an upright position. Breathe out normally. Place the mouthpiece in your mouth and seal your lips tightly around it. Breathe in slowly and as deeply as possible, raising the piston or the ball toward the top of the column. Hold your breath for 3-5 seconds or for as long as possible. Allow the piston or ball to fall to the bottom of the column. Remove the mouthpiece from your mouth and breathe out normally. Rest for a few seconds and repeat Steps 1 through 7 at least 10 times every 1-2 hours when you are awake. Take your time and take a few normal breaths between deep breaths. The spirometer may include an indicator to show your best effort. Use the indicator as a goal to work toward  during each repetition. After each set of 10 deep breaths, practice coughing to be sure your lungs are clear. If you have an incision (the cut made at the time of surgery), support your incision when coughing by placing a pillow or rolled up towels  firmly against it. Once you are able to get out of bed, walk around indoors and cough well. You may stop using the incentive spirometer when instructed by your caregiver.  RISKS AND COMPLICATIONS Take your time so you do not get dizzy or light-headed. If you are in pain, you may need to take or ask for pain medication before doing incentive spirometry. It is harder to take a deep breath if you are having pain. AFTER USE Rest and breathe slowly and easily. It can be helpful to keep track of a log of your progress. Your caregiver can provide you with a simple table to help with this. If you are using the spirometer at home, follow these instructions: [place] IF:  You are having difficultly using the spirometer. You have trouble using the spirometer as often as instructed. Your pain medication is not giving enough relief while using the spirometer. You develop fever of 100.5 F (38.1 C) or higher. SEEK IMMEDIATE MEDICAL CARE IF:  You cough up bloody sputum that had not been present before. You develop fever of 102 F (38.9 C) or greater. You develop worsening pain at or near the incision site. MAKE SURE YOU:  Understand these instructions. Will watch your condition. Will get help right away if you are not doing well or get worse. Document Released: 04/18/2007 Document Revised: 02/28/2012 Document Reviewed: 06/19/2007 ExitCare Patient Information 2014 ExitCare, [place].   ________________________________________________________________________      WHAT IS A BLOOD TRANSFUSION? Blood Transfusion Information  A transfusion is the replacement of blood or some of its parts. Blood is made up of multiple cells which provide different  functions. Red blood cells carry oxygen and are used for blood loss replacement. White blood cells fight against infection. Platelets control bleeding. Plasma helps clot blood. Other blood products are available for specialized needs, such as hemophilia or other clotting disorders. BEFORE THE TRANSFUSION  Who gives blood for transfusions?  Healthy volunteers who are fully evaluated to make sure their blood is safe. This is blood bank blood. Transfusion therapy is the safest it has ever been in the practice of medicine. Before blood is taken from a donor, a complete history is taken to make sure that person has no history of diseases nor engages in risky social behavior (examples are intravenous drug use or sexual activity with multiple partners). The donor's travel history is screened to minimize risk of transmitting infections, such as malaria. The donated blood is tested for signs of infectious diseases, such as HIV and hepatitis. The blood is then tested to be sure it is compatible with you in order to minimize the chance of a transfusion reaction. If you or a relative donates blood, this is often done in anticipation of surgery and is not appropriate for emergency situations. It takes many days to process the donated blood. RISKS AND COMPLICATIONS Although transfusion therapy is very safe and saves many lives, the main dangers of transfusion include:  Getting an infectious disease. Developing a transfusion reaction. This is an allergic reaction to something in the blood you were given. Every precaution is taken to prevent this. The decision to have a blood transfusion has been considered carefully by your caregiver before blood is given. Blood is not given unless the benefits outweigh the risks. AFTER THE TRANSFUSION Right after receiving a blood transfusion, you will usually feel much better and more energetic. This is especially true if your red blood cells have gotten low (anemic). The  transfusion raises  the level of the red blood cells which carry oxygen, and this usually causes an energy increase. The nurse administering the transfusion will monitor you carefully for complications. HOME CARE INSTRUCTIONS  No special instructions are needed after a transfusion. You may find your energy is better. Speak with your caregiver about any limitations on activity for underlying diseases you may have. SEEK MEDICAL CARE IF:  Your condition is not improving after your transfusion. You develop redness or irritation at the intravenous (IV) site. SEEK IMMEDIATE MEDICAL CARE IF:  Any of the following symptoms occur over the next 12 hours: Shaking chills. You have a temperature by mouth above 102 F (38.9 C), not controlled by medicine. Chest, back, or muscle pain. People around you feel you are not acting correctly or are confused. Shortness of breath or difficulty breathing. Dizziness and fainting. You get a rash or develop hives. You have a decrease in urine output. Your urine turns a dark color or changes to pink, red, or brown. Any of the following symptoms occur over the next 10 days: You have a temperature by mouth above 102 F (38.9 C), not controlled by medicine. Shortness of breath. Weakness after normal activity. The white part of the eye turns yellow (jaundice). You have a decrease in the amount of urine or are urinating less often. Your urine turns a dark color or changes to pink, red, or brown. Document Released: 12/03/2000 Document Revised: 02/28/2012 Document Reviewed: 07/22/2008 [facility] Patient Information 2014 [place], [place].  _______________________________________________________________________

## 2022-06-08 ENCOUNTER — Other Ambulatory Visit (HOSPITAL_COMMUNITY): Payer: Self-pay

## 2022-06-09 ENCOUNTER — Other Ambulatory Visit: Payer: Self-pay

## 2022-06-09 ENCOUNTER — Encounter (HOSPITAL_COMMUNITY)
Admission: RE | Admit: 2022-06-09 | Discharge: 2022-06-09 | Disposition: A | Discharge status: Home / Self Care | Payer: Medicare Other | Source: Ambulatory Visit | Attending: Surgery | Admitting: Surgery

## 2022-06-09 ENCOUNTER — Encounter (HOSPITAL_COMMUNITY): Payer: Self-pay

## 2022-06-09 VITALS — BP 158/80 | HR 83 | Temp 97.9°F | Resp 18 | Ht 63.0 in | Wt 110.4 lb

## 2022-06-09 DIAGNOSIS — Z01812 Encounter for preprocedural laboratory examination: Secondary | ICD-10-CM | POA: Diagnosis present

## 2022-06-09 DIAGNOSIS — R739 Hyperglycemia, unspecified: Secondary | ICD-10-CM | POA: Insufficient documentation

## 2022-06-09 DIAGNOSIS — Z01818 Encounter for other preprocedural examination: Secondary | ICD-10-CM

## 2022-06-09 HISTORY — DX: Malignant (primary) neoplasm, unspecified: C80.1

## 2022-06-09 LAB — CBC
HCT: 31.5 % — ABNORMAL LOW (ref 36.0–46.0)
Hemoglobin: 9.8 g/dL — ABNORMAL LOW (ref 12.0–15.0)
MCH: 26.1 pg (ref 26.0–34.0)
MCHC: 31.1 g/dL (ref 30.0–36.0)
MCV: 84 fL (ref 80.0–100.0)
Platelets: 304 10*3/uL (ref 150–400)
RBC: 3.75 MIL/uL — ABNORMAL LOW (ref 3.87–5.11)
RDW: 26.1 % — ABNORMAL HIGH (ref 11.5–15.5)
WBC: 7.3 10*3/uL (ref 4.0–10.5)
nRBC: 0 % (ref 0.0–0.2)

## 2022-06-09 LAB — HEMOGLOBIN A1C
Hgb A1c MFr Bld: 4.5 % — ABNORMAL LOW (ref 4.8–5.6)
Mean Plasma Glucose: 82.45 mg/dL

## 2022-06-15 ENCOUNTER — Other Ambulatory Visit (HOSPITAL_COMMUNITY): Payer: Self-pay

## 2022-06-17 ENCOUNTER — Other Ambulatory Visit (HOSPITAL_COMMUNITY): Payer: Self-pay

## 2022-06-17 ENCOUNTER — Inpatient Hospital Stay (HOSPITAL_COMMUNITY)
Admission: RE | Admit: 2022-06-17 | Discharge: 2022-06-25 | DRG: 330 | Disposition: A | Discharge status: Home / Self Care | Payer: Medicare Other | Attending: Surgery | Admitting: Surgery

## 2022-06-17 ENCOUNTER — Inpatient Hospital Stay (HOSPITAL_COMMUNITY): Payer: Medicare Other | Admitting: Physician Assistant

## 2022-06-17 ENCOUNTER — Encounter (HOSPITAL_COMMUNITY)
Admission: RE | Disposition: A | Discharge status: Home / Self Care | Payer: Self-pay | Source: Home / Self Care | Attending: Surgery

## 2022-06-17 ENCOUNTER — Other Ambulatory Visit: Payer: Self-pay

## 2022-06-17 ENCOUNTER — Inpatient Hospital Stay (HOSPITAL_COMMUNITY): Payer: Medicare Other | Admitting: Anesthesiology

## 2022-06-17 ENCOUNTER — Encounter: Payer: Self-pay | Admitting: Internal Medicine

## 2022-06-17 ENCOUNTER — Encounter (HOSPITAL_COMMUNITY): Payer: Self-pay | Admitting: Surgery

## 2022-06-17 DIAGNOSIS — E860 Dehydration: Secondary | ICD-10-CM | POA: Diagnosis not present

## 2022-06-17 DIAGNOSIS — Z833 Family history of diabetes mellitus: Secondary | ICD-10-CM

## 2022-06-17 DIAGNOSIS — C183 Malignant neoplasm of hepatic flexure: Secondary | ICD-10-CM | POA: Diagnosis present

## 2022-06-17 DIAGNOSIS — K567 Ileus, unspecified: Secondary | ICD-10-CM | POA: Diagnosis not present

## 2022-06-17 DIAGNOSIS — N1832 Chronic kidney disease, stage 3b: Secondary | ICD-10-CM

## 2022-06-17 DIAGNOSIS — M8580 Other specified disorders of bone density and structure, unspecified site: Secondary | ICD-10-CM | POA: Diagnosis present

## 2022-06-17 DIAGNOSIS — E876 Hypokalemia: Secondary | ICD-10-CM | POA: Diagnosis not present

## 2022-06-17 DIAGNOSIS — Z8249 Family history of ischemic heart disease and other diseases of the circulatory system: Secondary | ICD-10-CM | POA: Diagnosis not present

## 2022-06-17 DIAGNOSIS — N39490 Overflow incontinence: Secondary | ICD-10-CM | POA: Diagnosis present

## 2022-06-17 DIAGNOSIS — N182 Chronic kidney disease, stage 2 (mild): Secondary | ICD-10-CM | POA: Diagnosis present

## 2022-06-17 DIAGNOSIS — D509 Iron deficiency anemia, unspecified: Secondary | ICD-10-CM | POA: Diagnosis present

## 2022-06-17 DIAGNOSIS — D500 Iron deficiency anemia secondary to blood loss (chronic): Secondary | ICD-10-CM | POA: Diagnosis present

## 2022-06-17 DIAGNOSIS — N179 Acute kidney failure, unspecified: Secondary | ICD-10-CM | POA: Diagnosis not present

## 2022-06-17 DIAGNOSIS — C189 Malignant neoplasm of colon, unspecified: Secondary | ICD-10-CM | POA: Diagnosis present

## 2022-06-17 DIAGNOSIS — Z823 Family history of stroke: Secondary | ICD-10-CM

## 2022-06-17 DIAGNOSIS — Z808 Family history of malignant neoplasm of other organs or systems: Secondary | ICD-10-CM | POA: Diagnosis not present

## 2022-06-17 DIAGNOSIS — K660 Peritoneal adhesions (postprocedural) (postinfection): Secondary | ICD-10-CM | POA: Diagnosis present

## 2022-06-17 DIAGNOSIS — D62 Acute posthemorrhagic anemia: Secondary | ICD-10-CM | POA: Diagnosis not present

## 2022-06-17 DIAGNOSIS — M810 Age-related osteoporosis without current pathological fracture: Secondary | ICD-10-CM | POA: Diagnosis present

## 2022-06-17 DIAGNOSIS — R1110 Vomiting, unspecified: Secondary | ICD-10-CM | POA: Diagnosis not present

## 2022-06-17 DIAGNOSIS — Z8261 Family history of arthritis: Secondary | ICD-10-CM | POA: Diagnosis not present

## 2022-06-17 DIAGNOSIS — D649 Anemia, unspecified: Secondary | ICD-10-CM

## 2022-06-17 HISTORY — PX: OTHER SURGICAL HISTORY: SHX169

## 2022-06-17 LAB — TYPE AND SCREEN
ABO/RH(D): O POS
Antibody Screen: NEGATIVE

## 2022-06-17 LAB — ABO/RH: ABO/RH(D): O POS

## 2022-06-17 SURGERY — COLECTOMY, RIGHT, ROBOT-ASSISTED
Anesthesia: General | Laterality: Right

## 2022-06-17 MED ORDER — PHENYLEPHRINE HCL-NACL 20-0.9 MG/250ML-% IV SOLN
INTRAVENOUS | Status: AC
  Filled 2022-06-17: qty 250

## 2022-06-17 MED ORDER — BUPIVACAINE LIPOSOME 1.3 % IJ SUSP
INTRAMUSCULAR | Status: AC
  Filled 2022-06-17: qty 20

## 2022-06-17 MED ORDER — LACTATED RINGERS IV SOLN: INTRAVENOUS | Status: AC

## 2022-06-17 MED ORDER — ALVIMOPAN 12 MG PO CAPS
12.0000 mg | ORAL_CAPSULE | ORAL | Status: AC
  Administered 2022-06-17: 12 mg via ORAL
  Filled 2022-06-17: qty 1

## 2022-06-17 MED ORDER — SODIUM CHLORIDE 0.9 % IV SOLN
2.0000 g | INTRAVENOUS | Status: AC
  Administered 2022-06-17: 2 g via INTRAVENOUS
  Filled 2022-06-17: qty 2

## 2022-06-17 MED ORDER — SODIUM CHLORIDE 0.9% FLUSH
3.0000 mL | Freq: Two times a day (BID) | INTRAVENOUS | Status: DC
  Administered 2022-06-18 – 2022-06-20 (×5): 3 mL via INTRAVENOUS

## 2022-06-17 MED ORDER — ENSURE PRE-SURGERY PO LIQD
296.0000 mL | Freq: Once | ORAL | Status: DC
  Filled 2022-06-17: qty 296

## 2022-06-17 MED ORDER — FENTANYL CITRATE (PF) 250 MCG/5ML IJ SOLN
INTRAMUSCULAR | Status: AC
  Filled 2022-06-17: qty 5

## 2022-06-17 MED ORDER — SODIUM CHLORIDE 0.9 % IV SOLN: 250.0000 mL | INTRAVENOUS | Status: DC | PRN

## 2022-06-17 MED ORDER — BISACODYL 5 MG PO TBEC: 20.0000 mg | DELAYED_RELEASE_TABLET | Freq: Once | ORAL | Status: DC

## 2022-06-17 MED ORDER — HYDRALAZINE HCL 20 MG/ML IJ SOLN: 10.0000 mg | INTRAMUSCULAR | Status: DC | PRN

## 2022-06-17 MED ORDER — BUPIVACAINE LIPOSOME 1.3 % IJ SUSP
INTRAMUSCULAR | Status: DC | PRN
  Administered 2022-06-17: 20 mL

## 2022-06-17 MED ORDER — ROCURONIUM BROMIDE 100 MG/10ML IV SOLN
INTRAVENOUS | Status: DC | PRN
  Administered 2022-06-17: 60 mg via INTRAVENOUS

## 2022-06-17 MED ORDER — SODIUM CHLORIDE 0.9 % IR SOLN
Status: DC | PRN
  Administered 2022-06-17: 1000 mL

## 2022-06-17 MED ORDER — ENOXAPARIN SODIUM 40 MG/0.4ML IJ SOSY
40.0000 mg | PREFILLED_SYRINGE | Freq: Once | INTRAMUSCULAR | Status: AC
  Administered 2022-06-17: 40 mg via SUBCUTANEOUS
  Filled 2022-06-17: qty 0.4

## 2022-06-17 MED ORDER — LACTATED RINGERS IV BOLUS: 1000.0000 mL | Freq: Three times a day (TID) | INTRAVENOUS | Status: AC | PRN

## 2022-06-17 MED ORDER — BUPIVACAINE-EPINEPHRINE (PF) 0.5% -1:200000 IJ SOLN
INTRAMUSCULAR | Status: DC | PRN
  Administered 2022-06-17: 25 mL via PERINEURAL

## 2022-06-17 MED ORDER — HYDROMORPHONE HCL 1 MG/ML IJ SOLN: 0.5000 mg | INTRAMUSCULAR | Status: DC | PRN

## 2022-06-17 MED ORDER — PHENYLEPHRINE HCL-NACL 20-0.9 MG/250ML-% IV SOLN
INTRAVENOUS | Status: DC | PRN
  Administered 2022-06-17: 50 ug/min via INTRAVENOUS

## 2022-06-17 MED ORDER — DIPHENHYDRAMINE HCL 12.5 MG/5ML PO ELIX: 12.5000 mg | ORAL_SOLUTION | Freq: Four times a day (QID) | ORAL | Status: DC | PRN

## 2022-06-17 MED ORDER — SODIUM CHLORIDE (PF) 0.9 % IJ SOLN
INTRAMUSCULAR | Status: AC
  Filled 2022-06-17: qty 50

## 2022-06-17 MED ORDER — DEXAMETHASONE SODIUM PHOSPHATE 10 MG/ML IJ SOLN
INTRAMUSCULAR | Status: DC | PRN
  Administered 2022-06-17: 6 mg via INTRAVENOUS

## 2022-06-17 MED ORDER — CALCIUM CARBONATE 1500 (600 CA) MG PO TABS: 600.0000 mg | ORAL_TABLET | Freq: Every day | ORAL | Status: DC

## 2022-06-17 MED ORDER — MAGIC MOUTHWASH: 15.0000 mL | Freq: Four times a day (QID) | ORAL | Status: DC | PRN

## 2022-06-17 MED ORDER — PROPOFOL 10 MG/ML IV BOLUS
INTRAVENOUS | Status: AC
  Filled 2022-06-17: qty 20

## 2022-06-17 MED ORDER — CHLORHEXIDINE GLUCONATE 0.12 % MT SOLN
15.0000 mL | Freq: Once | OROMUCOSAL | Status: AC
  Administered 2022-06-17: 15 mL via OROMUCOSAL

## 2022-06-17 MED ORDER — ALVIMOPAN 12 MG PO CAPS
12.0000 mg | ORAL_CAPSULE | Freq: Two times a day (BID) | ORAL | Status: DC
  Administered 2022-06-18: 12 mg via ORAL
  Filled 2022-06-17 (×3): qty 1

## 2022-06-17 MED ORDER — SIMETHICONE 80 MG PO CHEW: 40.0000 mg | CHEWABLE_TABLET | Freq: Four times a day (QID) | ORAL | Status: DC | PRN

## 2022-06-17 MED ORDER — CALCIUM POLYCARBOPHIL 625 MG PO TABS
625.0000 mg | ORAL_TABLET | Freq: Two times a day (BID) | ORAL | Status: DC
  Administered 2022-06-17 – 2022-06-18 (×3): 625 mg via ORAL
  Filled 2022-06-17 (×3): qty 1

## 2022-06-17 MED ORDER — GABAPENTIN 300 MG PO CAPS
300.0000 mg | ORAL_CAPSULE | ORAL | Status: AC
  Administered 2022-06-17: 300 mg via ORAL
  Filled 2022-06-17: qty 1

## 2022-06-17 MED ORDER — METOPROLOL TARTRATE 5 MG/5ML IV SOLN: 5.0000 mg | Freq: Four times a day (QID) | INTRAVENOUS | Status: DC | PRN

## 2022-06-17 MED ORDER — SUGAMMADEX SODIUM 500 MG/5ML IV SOLN
INTRAVENOUS | Status: DC | PRN
  Administered 2022-06-17: 150 mg via INTRAVENOUS

## 2022-06-17 MED ORDER — TRAMADOL HCL 50 MG PO TABS: 50.0000 mg | ORAL_TABLET | Freq: Four times a day (QID) | ORAL | Status: DC | PRN

## 2022-06-17 MED ORDER — SODIUM CHLORIDE 0.9% FLUSH: 3.0000 mL | INTRAVENOUS | Status: DC | PRN

## 2022-06-17 MED ORDER — TRAMADOL HCL 50 MG PO TABS
50.0000 mg | ORAL_TABLET | Freq: Four times a day (QID) | ORAL | 0 refills | Status: DC | PRN
  Filled 2022-06-17: qty 20, 3d supply, fill #0

## 2022-06-17 MED ORDER — MELATONIN 3 MG PO TABS: 3.0000 mg | ORAL_TABLET | Freq: Every evening | ORAL | Status: DC | PRN

## 2022-06-17 MED ORDER — ENOXAPARIN SODIUM 40 MG/0.4ML IJ SOSY
40.0000 mg | PREFILLED_SYRINGE | INTRAMUSCULAR | Status: DC
  Administered 2022-06-18: 40 mg via SUBCUTANEOUS
  Filled 2022-06-17: qty 0.4

## 2022-06-17 MED ORDER — PROMETHAZINE HCL 25 MG/ML IJ SOLN: 6.2500 mg | INTRAMUSCULAR | Status: DC | PRN

## 2022-06-17 MED ORDER — FENTANYL CITRATE PF 50 MCG/ML IJ SOSY: 25.0000 ug | PREFILLED_SYRINGE | INTRAMUSCULAR | Status: DC | PRN

## 2022-06-17 MED ORDER — SODIUM CHLORIDE (PF) 0.9 % IJ SOLN
INTRAMUSCULAR | Status: DC | PRN
  Administered 2022-06-17: 25 mL via INTRAVENOUS

## 2022-06-17 MED ORDER — PROPOFOL 10 MG/ML IV BOLUS
INTRAVENOUS | Status: DC | PRN
  Administered 2022-06-17: 100 mg via INTRAVENOUS
  Administered 2022-06-17: 50 mg via INTRAVENOUS

## 2022-06-17 MED ORDER — ALUM & MAG HYDROXIDE-SIMETH 200-200-20 MG/5ML PO SUSP
30.0000 mL | Freq: Four times a day (QID) | ORAL | Status: DC | PRN
Start: 2022-06-17 — End: 2022-06-25

## 2022-06-17 MED ORDER — DIPHENHYDRAMINE HCL 50 MG/ML IJ SOLN: 12.5000 mg | Freq: Four times a day (QID) | INTRAMUSCULAR | Status: DC | PRN

## 2022-06-17 MED ORDER — BUPIVACAINE-EPINEPHRINE (PF) 0.5% -1:200000 IJ SOLN
INTRAMUSCULAR | Status: AC
  Filled 2022-06-17: qty 30

## 2022-06-17 MED ORDER — METRONIDAZOLE 500 MG PO TABS: 1000.0000 mg | ORAL_TABLET | ORAL | Status: DC

## 2022-06-17 MED ORDER — CALCIUM CARBONATE 1250 (500 CA) MG PO TABS
500.0000 mg | ORAL_TABLET | Freq: Every day | ORAL | Status: DC
  Administered 2022-06-18 – 2022-06-24 (×7): 1250 mg via ORAL
  Filled 2022-06-17 (×8): qty 1

## 2022-06-17 MED ORDER — ACETAMINOPHEN 500 MG PO TABS
1000.0000 mg | ORAL_TABLET | ORAL | Status: AC
  Administered 2022-06-17: 1000 mg via ORAL
  Filled 2022-06-17: qty 2

## 2022-06-17 MED ORDER — PROCHLORPERAZINE EDISYLATE 10 MG/2ML IJ SOLN
5.0000 mg | Freq: Four times a day (QID) | INTRAMUSCULAR | Status: DC | PRN
  Administered 2022-06-21: 10 mg via INTRAVENOUS
  Filled 2022-06-17: qty 2

## 2022-06-17 MED ORDER — METHOCARBAMOL 500 MG PO TABS: 1000.0000 mg | ORAL_TABLET | Freq: Four times a day (QID) | ORAL | Status: DC | PRN

## 2022-06-17 MED ORDER — LIDOCAINE HCL (CARDIAC) PF 100 MG/5ML IV SOSY
PREFILLED_SYRINGE | INTRAVENOUS | Status: DC | PRN
  Administered 2022-06-17: 20 mg via INTRAVENOUS
  Administered 2022-06-17: 60 mg via INTRAVENOUS

## 2022-06-17 MED ORDER — LACTATED RINGERS IV SOLN: INTRAVENOUS | Status: DC

## 2022-06-17 MED ORDER — ONDANSETRON HCL 4 MG/2ML IJ SOLN
4.0000 mg | Freq: Four times a day (QID) | INTRAMUSCULAR | Status: DC | PRN
  Administered 2022-06-20 – 2022-06-21 (×2): 4 mg via INTRAVENOUS
  Filled 2022-06-17 (×4): qty 2

## 2022-06-17 MED ORDER — LACTATED RINGERS IR SOLN
Status: DC | PRN
  Administered 2022-06-17: 1000 mL

## 2022-06-17 MED ORDER — AMISULPRIDE (ANTIEMETIC) 5 MG/2ML IV SOLN: 10.0000 mg | Freq: Once | INTRAVENOUS | Status: DC | PRN

## 2022-06-17 MED ORDER — ENSURE SURGERY PO LIQD
237.0000 mL | Freq: Two times a day (BID) | ORAL | Status: DC
Start: 2022-06-18 — End: 2022-06-18

## 2022-06-17 MED ORDER — SODIUM CHLORIDE 0.9 % IV SOLN
2.0000 g | Freq: Two times a day (BID) | INTRAVENOUS | Status: AC
  Administered 2022-06-18: 2 g via INTRAVENOUS
  Filled 2022-06-17: qty 2

## 2022-06-17 MED ORDER — FENTANYL CITRATE (PF) 100 MCG/2ML IJ SOLN
INTRAMUSCULAR | Status: DC | PRN
  Administered 2022-06-17: 50 ug via INTRAVENOUS
  Administered 2022-06-17: 100 ug via INTRAVENOUS

## 2022-06-17 MED ORDER — ORAL CARE MOUTH RINSE: 15.0000 mL | Freq: Once | OROMUCOSAL | Status: AC

## 2022-06-17 MED ORDER — BUPIVACAINE LIPOSOME 1.3 % IJ SUSP: 20.0000 mL | Freq: Once | INTRAMUSCULAR | Status: DC

## 2022-06-17 MED ORDER — METHOCARBAMOL 1000 MG/10ML IJ SOLN: 1000.0000 mg | Freq: Four times a day (QID) | INTRAVENOUS | Status: DC | PRN

## 2022-06-17 MED ORDER — SPY AGENT GREEN - (INDOCYANINE FOR INJECTION)
INTRAMUSCULAR | Status: DC | PRN
  Administered 2022-06-17: 3 mL via INTRAVENOUS

## 2022-06-17 MED ORDER — ONDANSETRON HCL 4 MG PO TABS: 4.0000 mg | ORAL_TABLET | Freq: Four times a day (QID) | ORAL | Status: DC | PRN

## 2022-06-17 MED ORDER — PROCHLORPERAZINE MALEATE 10 MG PO TABS: 10.0000 mg | ORAL_TABLET | Freq: Four times a day (QID) | ORAL | Status: DC | PRN

## 2022-06-17 MED ORDER — ENSURE PRE-SURGERY PO LIQD
592.0000 mL | Freq: Once | ORAL | Status: DC
  Filled 2022-06-17: qty 592

## 2022-06-17 MED ORDER — ENALAPRILAT 1.25 MG/ML IV SOLN: 0.6250 mg | Freq: Four times a day (QID) | INTRAVENOUS | Status: DC | PRN

## 2022-06-17 MED ORDER — LIDOCAINE HCL (PF) 2 % IJ SOLN
INTRAMUSCULAR | Status: AC
  Filled 2022-06-17: qty 15

## 2022-06-17 MED ORDER — LIP MEDEX EX OINT
TOPICAL_OINTMENT | Freq: Two times a day (BID) | CUTANEOUS | Status: DC
  Administered 2022-06-17: 1 via TOPICAL
  Administered 2022-06-18: 75 via TOPICAL
  Administered 2022-06-19: 1 via TOPICAL
  Administered 2022-06-20 – 2022-06-23 (×2): 75 via TOPICAL
  Administered 2022-06-24: 1 via TOPICAL
  Filled 2022-06-17 (×3): qty 7

## 2022-06-17 MED ORDER — ACETAMINOPHEN 500 MG PO TABS
1000.0000 mg | ORAL_TABLET | Freq: Four times a day (QID) | ORAL | Status: DC
  Administered 2022-06-17 – 2022-06-21 (×11): 1000 mg via ORAL
  Filled 2022-06-17 (×15): qty 2

## 2022-06-17 MED ORDER — LIDOCAINE HCL (PF) 2 % IJ SOLN
INTRAMUSCULAR | Status: DC | PRN
  Administered 2022-06-17: 1.5 mg/kg/h via INTRADERMAL

## 2022-06-17 MED ORDER — POLYETHYLENE GLYCOL 3350 17 GM/SCOOP PO POWD: 1.0000 | Freq: Once | ORAL | Status: DC

## 2022-06-17 MED ORDER — NEOMYCIN SULFATE 500 MG PO TABS: 1000.0000 mg | ORAL_TABLET | ORAL | Status: DC

## 2022-06-17 SURGICAL SUPPLY — 120 items
APPLIER CLIP 5 13 M/L LIGAMAX5 (MISCELLANEOUS)
APPLIER CLIP ROT 10 11.4 M/L (STAPLE)
BAG COUNTER SPONGE SURGICOUNT (BAG) ×2 IMPLANT
BLADE EXTENDED COATED 6.5IN (ELECTRODE) IMPLANT
CANNULA REDUC XI 12-8 STAPL (CANNULA)
CANNULA REDUCER 12-8 DVNC XI (CANNULA) IMPLANT
CELLS DAT CNTRL 66122 CELL SVR (MISCELLANEOUS) ×1 IMPLANT
CHLORAPREP W/TINT 26 (MISCELLANEOUS) IMPLANT
CLIP APPLIE 5 13 M/L LIGAMAX5 (MISCELLANEOUS) IMPLANT
CLIP APPLIE ROT 10 11.4 M/L (STAPLE) IMPLANT
COVER SURGICAL LIGHT HANDLE (MISCELLANEOUS) ×4 IMPLANT
COVER TIP SHEARS 8 DVNC (MISCELLANEOUS) ×1 IMPLANT
COVER TIP SHEARS 8MM DA VINCI (MISCELLANEOUS) ×1
DEVICE TROCAR PUNCTURE CLOSURE (ENDOMECHANICALS) IMPLANT
DRAIN CHANNEL 19F RND (DRAIN) IMPLANT
DRAPE ARM DVNC X/XI (DISPOSABLE) ×4 IMPLANT
DRAPE COLUMN DVNC XI (DISPOSABLE) ×1 IMPLANT
DRAPE DA VINCI XI ARM (DISPOSABLE) ×4
DRAPE DA VINCI XI COLUMN (DISPOSABLE) ×1
DRAPE SURG IRRIG POUCH 19X23 (DRAPES) ×2 IMPLANT
DRSG OPSITE POSTOP 3X4 (GAUZE/BANDAGES/DRESSINGS) ×1 IMPLANT
DRSG OPSITE POSTOP 4X10 (GAUZE/BANDAGES/DRESSINGS) IMPLANT
DRSG OPSITE POSTOP 4X6 (GAUZE/BANDAGES/DRESSINGS) IMPLANT
DRSG OPSITE POSTOP 4X8 (GAUZE/BANDAGES/DRESSINGS) IMPLANT
DRSG TEGADERM 2-3/8X2-3/4 SM (GAUZE/BANDAGES/DRESSINGS) ×10 IMPLANT
DRSG TEGADERM 4X4.75 (GAUZE/BANDAGES/DRESSINGS) IMPLANT
ELECT PENCIL ROCKER SW 15FT (MISCELLANEOUS) ×2 IMPLANT
ELECT REM PT RETURN 15FT ADLT (MISCELLANEOUS) ×2 IMPLANT
ENDOLOOP SUT PDS II  0 18 (SUTURE)
ENDOLOOP SUT PDS II 0 18 (SUTURE) IMPLANT
EVACUATOR SILICONE 100CC (DRAIN) IMPLANT
GAUZE SPONGE 2X2 8PLY STRL LF (GAUZE/BANDAGES/DRESSINGS) ×1 IMPLANT
GLOVE ECLIPSE 8.0 STRL XLNG CF (GLOVE) ×6 IMPLANT
GLOVE INDICATOR 8.0 STRL GRN (GLOVE) ×6 IMPLANT
GOWN SRG XL LVL 4 BRTHBL STRL (GOWNS) ×1 IMPLANT
GOWN STRL NON-REIN XL LVL4 (GOWNS) ×1
GOWN STRL REUS W/ TWL XL LVL3 (GOWN DISPOSABLE) ×4 IMPLANT
GOWN STRL REUS W/TWL XL LVL3 (GOWN DISPOSABLE) ×4
GRASPER SUT TROCAR 14GX15 (MISCELLANEOUS) IMPLANT
HOLDER FOLEY CATH W/STRAP (MISCELLANEOUS) ×2 IMPLANT
IRRIG SUCT STRYKERFLOW 2 WTIP (MISCELLANEOUS) ×2
IRRIGATION SUCT STRKRFLW 2 WTP (MISCELLANEOUS) ×1 IMPLANT
KIT PROCEDURE DA VINCI SI (MISCELLANEOUS)
KIT PROCEDURE DVNC SI (MISCELLANEOUS) IMPLANT
KIT SIGMOIDOSCOPE (SET/KITS/TRAYS/PACK) IMPLANT
KIT TURNOVER KIT A (KITS) IMPLANT
NDL INSUFFLATION 14GA 120MM (NEEDLE) ×1 IMPLANT
NEEDLE INSUFFLATION 14GA 120MM (NEEDLE) ×2 IMPLANT
PACK CARDIOVASCULAR III (CUSTOM PROCEDURE TRAY) ×2 IMPLANT
PACK COLON (CUSTOM PROCEDURE TRAY) ×2 IMPLANT
PAD POSITIONING PINK XL (MISCELLANEOUS) ×2 IMPLANT
PROTECTOR NERVE ULNAR (MISCELLANEOUS) ×4 IMPLANT
RELOAD STAPLE 45 3.5 BLU DVNC (STAPLE) IMPLANT
RELOAD STAPLE 45 4.3 GRN DVNC (STAPLE) IMPLANT
RELOAD STAPLE 60 2.5 WHT DVNC (STAPLE) IMPLANT
RELOAD STAPLE 60 3.5 BLU DVNC (STAPLE) IMPLANT
RELOAD STAPLE 60 4.3 GRN DVNC (STAPLE) IMPLANT
RELOAD STAPLER 2.5X60 WHT DVNC (STAPLE) ×1 IMPLANT
RELOAD STAPLER 3.5X45 BLU DVNC (STAPLE) IMPLANT
RELOAD STAPLER 3.5X60 BLU DVNC (STAPLE) ×2 IMPLANT
RELOAD STAPLER 4.3X45 GRN DVNC (STAPLE) IMPLANT
RELOAD STAPLER 4.3X60 GRN DVNC (STAPLE) IMPLANT
RETRACTOR WND ALEXIS 18 MED (MISCELLANEOUS) IMPLANT
RTRCTR WOUND ALEXIS 18CM MED (MISCELLANEOUS) ×2
SCISSORS LAP 5X35 DISP (ENDOMECHANICALS) ×2 IMPLANT
SEAL CANN UNIV 5-8 DVNC XI (MISCELLANEOUS) ×3 IMPLANT
SEAL XI 5MM-8MM UNIVERSAL (MISCELLANEOUS) ×3
SEALER VESSEL DA VINCI XI (MISCELLANEOUS) ×1
SEALER VESSEL EXT DVNC XI (MISCELLANEOUS) ×1 IMPLANT
SOLUTION ELECTROLUBE (MISCELLANEOUS) ×2 IMPLANT
SPIKE FLUID TRANSFER (MISCELLANEOUS) ×2 IMPLANT
SPONGE GAUZE 2X2 STER 10/PKG (GAUZE/BANDAGES/DRESSINGS) ×1
STAPLER 45 DA VINCI SURE FORM (STAPLE)
STAPLER 45 SUREFORM DVNC (STAPLE) IMPLANT
STAPLER 60 DA VINCI SURE FORM (STAPLE) ×1
STAPLER 60 SUREFORM DVNC (STAPLE) IMPLANT
STAPLER CANNULA SEAL DVNC XI (STAPLE) ×1 IMPLANT
STAPLER CANNULA SEAL XI (STAPLE) ×1
STAPLER ECHELON POWER CIR 29 (STAPLE) IMPLANT
STAPLER ECHELON POWER CIR 31 (STAPLE) IMPLANT
STAPLER RELOAD 2.5X60 WHITE (STAPLE) ×1
STAPLER RELOAD 2.5X60 WHT DVNC (STAPLE) ×1
STAPLER RELOAD 3.5X45 BLU DVNC (STAPLE)
STAPLER RELOAD 3.5X45 BLUE (STAPLE)
STAPLER RELOAD 3.5X60 BLU DVNC (STAPLE) ×2
STAPLER RELOAD 3.5X60 BLUE (STAPLE) ×2
STAPLER RELOAD 4.3X45 GREEN (STAPLE)
STAPLER RELOAD 4.3X45 GRN DVNC (STAPLE)
STAPLER RELOAD 4.3X60 GREEN (STAPLE)
STAPLER RELOAD 4.3X60 GRN DVNC (STAPLE)
STOPCOCK 4 WAY LG BORE MALE ST (IV SETS) ×4 IMPLANT
SURGILUBE 2OZ TUBE FLIPTOP (MISCELLANEOUS) IMPLANT
SUT MNCRL AB 4-0 PS2 18 (SUTURE) ×2 IMPLANT
SUT PDS AB 1 CT1 27 (SUTURE) ×4 IMPLANT
SUT PROLENE 0 CT 2 (SUTURE) IMPLANT
SUT PROLENE 2 0 KS (SUTURE) IMPLANT
SUT PROLENE 2 0 SH DA (SUTURE) IMPLANT
SUT SILK 2 0 (SUTURE)
SUT SILK 2 0 SH CR/8 (SUTURE) IMPLANT
SUT SILK 2-0 18XBRD TIE 12 (SUTURE) IMPLANT
SUT SILK 3 0 (SUTURE)
SUT SILK 3 0 SH CR/8 (SUTURE) ×2 IMPLANT
SUT SILK 3-0 18XBRD TIE 12 (SUTURE) IMPLANT
SUT V-LOC BARB 180 2/0GR6 GS22 (SUTURE)
SUT VIC AB 3-0 SH 18 (SUTURE) IMPLANT
SUT VIC AB 3-0 SH 27 (SUTURE)
SUT VIC AB 3-0 SH 27XBRD (SUTURE) IMPLANT
SUT VICRYL 0 UR6 27IN ABS (SUTURE) ×2 IMPLANT
SUT VLOC 180 2-0 6IN GS21 (SUTURE) ×2 IMPLANT
SUTURE V-LC BRB 180 2/0GR6GS22 (SUTURE) IMPLANT
SYR 10ML ECCENTRIC (SYRINGE) ×2 IMPLANT
SYS LAPSCP GELPORT 120MM (MISCELLANEOUS)
SYS WOUND ALEXIS 18CM MED (MISCELLANEOUS) ×2
SYSTEM LAPSCP GELPORT 120MM (MISCELLANEOUS) IMPLANT
SYSTEM WOUND ALEXIS 18CM MED (MISCELLANEOUS) ×1 IMPLANT
TOWEL OR NON WOVEN STRL DISP B (DISPOSABLE) ×2 IMPLANT
TRAY FOLEY MTR SLVR 16FR STAT (SET/KITS/TRAYS/PACK) ×2 IMPLANT
TROCAR ADV FIXATION 5X100MM (TROCAR) ×2 IMPLANT
TUBING CONNECTING 10 (TUBING) ×4 IMPLANT
TUBING INSUFFLATION 10FT LAP (TUBING) ×2 IMPLANT

## 2022-06-17 NOTE — Progress Notes (Signed)
Note: Portions of this report may have been transcribed using voice recognition software. A sincere effort was made to ensure accuracy; however, inadvertent computerized transcription errors may be present.   Any transcriptional errors that result from this process are unintentional.        Ann [name]  1935/11/25 [id number]  Patient Care Team: Hoyt Koch, MD as PCP - General (Internal Medicine) [facility] Doran Stabler, MD as Consulting Physician (Gastroenterology) Michael Boston, MD as Consulting Physician (General Surgery) Hurman Horn, MD as Consulting Physician (Ophthalmology)  Patient resting recovery room.  Vital signs stable.  PACU nurse at bedside.  Pain controlled.  Updated the patient OR findings.  I discussed operative findings, updated the patient's status, discussed probable steps to recovery, and gave postoperative recommendations to the patient's daughter, Mardene Celeste .  Recommendations were made.  Questions were answered.  She expressed understanding & appreciation.   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[place]) 06/01/2022    Priority: Medium    Colon cancer ([place]) 06/17/2022   IDA (iron deficiency anemia) 06/01/2022   Anemia 04/15/2022   Right lower quadrant abdominal pain 04/15/2022   Weight loss, unintentional 04/15/2022   History of vitrectomy 07/07/2021   Macular hole of right eye 07/07/2021   Routine general medical examination at a health care facility 03/27/2021   Family history of diabetes mellitus 03/27/2021   Osteopenia 03/27/2021    Past Medical History:  Diagnosis Date   Anemia    Cancer ([place])    colon   Chicken pox     Past Surgical History:  Procedure Laterality Date   EYE SURGERY     macular hole repair bil cataract bil    Social History   Socioeconomic History   Marital status: Widowed    Spouse name: Not on file   Number of children: Not  on file   Years of education: Not on file   Highest education level: Not on file  Occupational History   Not on file  Tobacco Use   Smoking status: Never   Smokeless tobacco: Never  Vaping Use   Vaping Use: Never used  Substance and Sexual Activity   Alcohol use: Not Currently    Alcohol/week: 1.0 standard drink of alcohol    Types: 1 Glasses of wine per week    Comment: social   Drug use: Never   Sexual activity: Not Currently    Partners: Male  Other Topics Concern   Not on file  Social History Narrative   [place] ancestry   Originally from [place] Strain: Low Risk  (04/16/2022)   Overall Financial Resource Strain (CARDIA)    Difficulty of Paying Living Expenses: Not hard at all  Food Insecurity: No Food Insecurity (04/16/2022)   Hunger Vital Sign    Worried About Running Out of Food in the Last Year: Never true    [place] in the Last Year: Never true  Transportation Needs: No Transportation Needs (04/16/2022)   PRAPARE - Hydrologist (Medical): No    Lack of Transportation (Non-Medical): No  Physical Activity: Insufficiently Active (04/16/2022)   Exercise Vital Sign    Days of Exercise per Week: 3 days    Minutes of Exercise per Session: 30 min  Stress: No Stress Concern Present (04/16/2022)   [employer]  [employer] - Occupational Stress Questionnaire    Feeling of Stress : Not at all  Social Connections: Moderately Integrated (04/16/2022)   Social Connection and Isolation Panel [NHANES]    Frequency of Communication with Friends and Family: Twice a week    Frequency of Social Gatherings with Friends and Family: Twice a week    Attends Religious Services: More than 4 times per year    Active Member of [employer] or Organizations: Yes    Attends Archivist Meetings: 1 to 4 times per year    Marital Status: Widowed  Intimate Partner Violence: Not At Risk  (04/16/2022)   Humiliation, Afraid, Rape, and Kick questionnaire    Fear of Current or Ex-Partner: No    Emotionally Abused: No    Physically Abused: No    Sexually Abused: No    Family History  Problem Relation Age of Onset   Cancer Mother    Diabetes Mother    Early death Mother    Arthritis Father    Cancer Sister    Diabetes Sister    Stroke Sister    Heart disease Brother    Heart attack Brother    Colon cancer Neg Hx    Rectal cancer Neg Hx    Stomach cancer Neg Hx     Current Facility-Administered Medications  Medication Dose Route Frequency Provider Last Rate Last Admin   amisulpride (BARHEMSYS) injection 10 mg  10 mg Intravenous Once PRN Duane Boston, MD       bisacodyl (DULCOLAX) EC tablet 20 mg  20 mg Oral Once Michael Boston, MD       bupivacaine liposome (EXPAREL) 1.3 % injection 266 mg  20 mL Infiltration Once Michael Boston, MD       bupivacaine liposome (EXPAREL) 1.3 % injection    PRN Michael Boston, MD   20 mL at 06/17/22 1615   bupivacaine-epinephrine (PF) (MARCAINE W/ EPI) 0.5% -1:200000 injection    PRN Michael Boston, MD   25 mL at 06/17/22 1615   [START ON 06/18/2022] feeding supplement (ENSURE PRE-SURGERY) liquid 296 mL  296 mL Oral Once Michael Boston, MD       feeding supplement (ENSURE PRE-SURGERY) liquid 592 mL  592 mL Oral Once Michael Boston, MD       fentaNYL (SUBLIMAZE) injection 25-50 mcg  25-50 mcg Intravenous Q5 min PRN Duane Boston, MD       lactated ringers infusion   Intravenous Continuous Ellender, Karyl Kinnier, MD 10 mL/hr at 06/17/22 1408 New Bag at 06/17/22 1619   lactated ringers irrigation solution    PRN Jaja Switalski, Remo Lipps, MD   1,000 mL at 06/17/22 1517   promethazine (PHENERGAN) injection 6.25-12.5 mg  6.25-12.5 mg Intravenous Q15 min PRN Duane Boston, MD       sodium chloride (PF) 0.9 % injection    PRN Michael Boston, MD   25 mL at 06/17/22 1615   sodium chloride irrigation 0.9 %    PRN Michael Boston, MD   1,000 mL at 06/17/22 1518     No Known  Allergies  BP (!) 160/70 (BP Location: Right Arm)   Pulse 81   Temp (!) 97.2 F (36.2 C)   Resp (!) 26   Ht '5\' 3"'$  (1.6 m)   Wt 50.1 kg   SpO2 100%   BMI 19.55 kg/m   No results found.

## 2022-06-17 NOTE — Anesthesia Procedure Notes (Signed)
Procedure Name: Intubation Date/Time: 06/17/2022 2:22 PM  Performed by: Jonna Munro, CRNAPre-anesthesia Checklist: Patient identified, Emergency Drugs available, Suction available, Patient being monitored and Timeout performed Patient Re-evaluated:Patient Re-evaluated prior to induction Oxygen Delivery Method: Circle system utilized Preoxygenation: Pre-oxygenation with 100% oxygen Induction Type: IV induction Ventilation: Mask ventilation without difficulty Laryngoscope Size: Miller and 1 Grade View: Grade III Tube type: Oral Tube size: 7.0 mm Number of attempts: 1 Airway Equipment and Method: Stylet Placement Confirmation: positive ETCO2, ETT inserted through vocal cords under direct vision, CO2 detector and breath sounds checked- equal and bilateral Secured at: 22 cm Tube secured with: Tape Dental Injury: Teeth and Oropharynx as per pre-operative assessment

## 2022-06-17 NOTE — Interval H&P Note (Signed)
History and Physical Interval Note:  06/17/2022 11:44 AM  Ann [name]  has presented today for surgery, with the diagnosis of COLON CANCER.  The various methods of treatment have been discussed with the patient and family. After consideration of risks, benefits and other options for treatment, the patient has consented to  Procedure(s): XI ROBOT ASSISTED RIGHT COLECTOMY (Right) as a surgical intervention.  The patient's history has been reviewed, patient examined, no change in status, stable for surgery.  I have reviewed the patient's chart and labs.  Questions were answered to the patient's satisfaction.    I have re-reviewed the the patient's records, history, medications, and allergies.  I have re-examined the patient.  I again discussed intraoperative plans and goals of post-operative recovery.  The patient agrees to proceed.  Ann [name]  22-Mar-1935 [id number]  Patient Care Team: Hoyt Koch, MD as PCP - General (Internal Medicine) [facility] 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[place]) 06/01/2022    Priority: Medium    IDA (iron deficiency anemia) 06/01/2022   Anemia 04/15/2022   Right lower quadrant abdominal pain 04/15/2022   Weight loss, unintentional 04/15/2022   History of vitrectomy 07/07/2021   Macular hole of right eye 07/07/2021   Routine general medical examination at a health care facility 03/27/2021   Family history of diabetes mellitus 03/27/2021   Osteopenia 03/27/2021    Past Medical History:  Diagnosis Date   Anemia    Cancer ([place])    colon   Chicken pox     Past Surgical History:  Procedure Laterality Date   EYE SURGERY     macular hole repair bil cataract bil    Social  History   Socioeconomic History   Marital status: Widowed    Spouse name: Not on file   Number of children: Not on file   Years of education: Not on file   Highest education level: Not on file  Occupational History   Not on file  Tobacco Use   Smoking status: Never   Smokeless tobacco: Never  Vaping Use   Vaping Use: Never used  Substance and Sexual Activity   Alcohol use: Not Currently    Alcohol/week: 1.0 standard drink of alcohol    Types: 1 Glasses of wine per week    Comment: social   Drug use: Never   Sexual activity: Not Currently    Partners: Male  Other Topics Concern   Not on file  Social History Narrative   [place] ancestry   Originally from [place] Strain: Low Risk  (04/16/2022)   Overall Financial Resource Strain (CARDIA)    Difficulty of Paying Living Expenses: Not hard at all  Food Insecurity: No Food Insecurity (04/16/2022)   Hunger Vital Sign    Worried About Running Out of Food in the Last Year: Never true    [place] in the Last Year: Never true  Transportation Needs: No Transportation Needs (04/16/2022)   PRAPARE - Hydrologist (Medical): No    Lack of Transportation (Non-Medical): No  Physical Activity: Insufficiently Active (04/16/2022)   Exercise Vital Sign    Days of Exercise per Week: 3 days  Minutes of Exercise per Session: 30 min  Stress: No Stress Concern Present (04/16/2022)   [place]    Feeling of Stress : Not at all  Social Connections: Moderately Integrated (04/16/2022)   Social Connection and Isolation Panel [NHANES]    Frequency of Communication with Friends and Family: Twice a week    Frequency of Social Gatherings with Friends and Family: Twice a week    Attends Religious Services: More than 4 times per year    Active Member of [employer] or Organizations: Yes    Attends Theatre manager Meetings: 1 to 4 times per year    Marital Status: Widowed  Intimate Partner Violence: Not At Risk (04/16/2022)   Humiliation, Afraid, Rape, and Kick questionnaire    Fear of Current or Ex-Partner: No    Emotionally Abused: No    Physically Abused: No    Sexually Abused: No    Family History  Problem Relation Age of Onset   Cancer Mother    Diabetes Mother    Early death Mother    Arthritis Father    Cancer Sister    Diabetes Sister    Stroke Sister    Heart disease Brother    Heart attack Brother    Colon cancer Neg Hx    Rectal cancer Neg Hx    Stomach cancer Neg Hx     Medications Prior to Admission  Medication Sig Dispense Refill Last Dose   B Complex Vitamins (B COMPLEX 1 PO) Take 1 tablet by mouth daily.      Biotin w/ Vitamins C & E (HAIR/SKIN/NAILS PO) Take 1 tablet by mouth daily.      calcium carbonate (OSCAL) 1500 (600 Ca) MG TABS tablet Take 600 mg of elemental calcium by mouth daily with breakfast.      ferrous sulfate 325 (65 FE) MG tablet Take 325 mg by mouth daily with breakfast.      MAGNESIUM PO Take 1 tablet by mouth daily.       No current facility-administered medications for this encounter.     No Known Allergies  BP (!) 149/89   Pulse 94   Temp 97.7 F (36.5 C) (Oral)   Resp 18   Ht '5\' 3"'$  (1.6 m)   Wt 50.1 kg   SpO2 100%   BMI 19.55 kg/m   Labs: No results found for this or any previous visit (from the past 48 hour(s)).  Imaging / Studies: No results found.   Adin Hector, M.D., F.A.C.S. Gastrointestinal and Minimally Invasive Surgery [facility] [address] [place], [place] [address] [phone] Main / Paging  06/17/2022 11:45 AM    Adin Hector

## 2022-06-17 NOTE — Anesthesia Preprocedure Evaluation (Addendum)
Anesthesia Evaluation  Patient identified by MRN, date of birth, ID band Patient awake    Reviewed: Allergy & Precautions, NPO status , Patient's Chart, lab work & pertinent test results  History of Anesthesia Complications Negative for: history of anesthetic complications  Airway Mallampati: II  TM Distance: >3 FB Neck ROM: Full    Dental no notable dental hx. (+) Dental Advisory Given   Pulmonary neg pulmonary ROS,    Pulmonary exam normal        Cardiovascular negative cardio ROS Normal cardiovascular exam     Neuro/Psych negative neurological ROS     GI/Hepatic Neg liver ROS,   Endo/Other  negative endocrine ROS  Renal/GU negative Renal ROS     Musculoskeletal negative musculoskeletal ROS (+)   Abdominal   Peds  Hematology  (+) Blood dyscrasia, anemia ,   Anesthesia Other Findings   Reproductive/Obstetrics                            Anesthesia Physical Anesthesia Plan  ASA: 3  Anesthesia Plan: General   Post-op Pain Management: Tylenol PO (pre-op)*   Induction: Intravenous  PONV Risk Score and Plan: 4 or greater and Ondansetron, Dexamethasone, Diphenhydramine and Treatment may vary due to age or medical condition  Airway Management Planned: Oral ETT  Additional Equipment:   Intra-op Plan:   Post-operative Plan: Extubation in OR  Informed Consent: I have reviewed the patients History and Physical, chart, labs and discussed the procedure including the risks, benefits and alternatives for the proposed anesthesia with the patient or authorized representative who has indicated his/her understanding and acceptance.     Dental advisory given  Plan Discussed with: Anesthesiologist and CRNA  Anesthesia Plan Comments:        Anesthesia Quick Evaluation

## 2022-06-17 NOTE — H&P (Signed)
06/17/2022   REFERRING PHYSICIAN: Lorella Nimrod,*  Patient Care Team: Charna Busman, MD as PCP - General (Internal Medicine) Johney Maine, Adrian Saran, MD as Consulting Provider (General Surgery) Danis, Lowry Ram, MD (Gastroenterology) Zadie Rhine Clent Demark, MD (Ophthalmology)  PROVIDER: Hollace Kinnier, MD  [facility] MRN: [id number] DOB: [birth year]  SUBJECTIVE   Chief Complaint: New: Cancer  History of Present Illness: Ann [name] is a 87 y.o. female who is seen today  as an office consultation at the request of Dr. Loletha Carrow for evaluation of colon cancer  .   Pleasant active elderly woman. Here today with a close friend, and. They are both from [place] but relocated to [place]. Her daughter still lives up in [place]. She has had some anemia for a while. Taking some iron supplementation. Noticed some bowel changes and crampiness with some unintentional weight loss. Based on concerns had CAT scan that raise suspicion of a mass in the hepatic flexure. Colonoscopy done confirming obvious tumor circumferential nearly obstructing. Seems consistent with the hepatic flexure. Surgical consultation recommended.  Patient is never had any abdominal surgery. She rarely sees doctors and claims she has been healthy most of her life. She does not smoke. Does not drink. No diabetes. No heart or lung issues that she is aware of. Takes some vitamin supplements. She can walk at least 1/2-hour without difficulty. No sleep apnea. She usually moves her bowels once in the morning but notes that she has had some crampiness and occasional loose bowel movements. She occasionally gets some right lower quadrant abdominal pain when she feels constipated but then gets better. Denies really any postprandial nausea or vomiting. No right upper quadrant pain. She can tolerate heavier fried foods but tends to avoid them in principal.  Medical History:  Past Medical History:  Diagnosis Date  Anemia    Patient Active Problem List  Diagnosis  Primary cancer of hepatic flexure of colon (CMS-HCC)  Iron deficiency anemia due to chronic blood loss   History reviewed. No pertinent surgical history.   No Known Allergies  Current Outpatient Medications on File Prior to Visit  Medication Sig Dispense Refill  sodium, potassium, and magnesium (SUPREP) oral solution as directed  biotin (HAIR, SKIN AND NAILS, BIOTIN,) 10,000 mcg Chew Take 1 tablet by mouth once daily  calcium carbonate 600 mg calcium (1,500 mg) Tab tablet Take by mouth  ferrous sulfate 325 (65 FE) MG tablet Take 325 mg by mouth daily with breakfast  vitamin B complex (B COMPLEX-VITAMIN B12 ORAL) Take 1 tablet by mouth once daily   No current facility-administered medications on file prior to visit.   Family History  Problem Relation Age of Onset  Diabetes Mother  Stroke Sister  Skin cancer Sister  Diabetes Sister  Coronary Artery Disease (Blocked arteries around heart) Brother    Social History   Tobacco Use  Smoking Status Never  Smokeless Tobacco Never    Social History   Socioeconomic History  Marital status: Widowed  Tobacco Use  Smoking status: Never  Smokeless tobacco: Never  Substance and Sexual Activity  Alcohol use: Yes  Drug use: Never  Social History Narrative  [place] ancestry  Originally from [place]   ############################################################  Review of Systems: A complete review of systems (ROS) was obtained from the patient. I have reviewed this information and discussed as appropriate with the patient. See HPI as well for other pertinent ROS.  Constitutional: No fevers, chills, sweats. Weight stable Eyes: No vision changes, No  discharge HENT: No sore throats, nasal drainage Lymph: No neck swelling, No bruising easily Pulmonary: No cough, productive sputum CV: No orthopnea, PND . No exertional chest/neck/shoulder/arm pain. Patient can walk 30 minutes without  difficulty.   GI: No personal nor family history of GI, inflammatory bowel disease, irritable bowel syndrome, allergy such as Celiac Sprue, dietary/dairy problems, colitis, ulcers nor gastritis. No recent sick contacts/gastroenteritis. No travel outside the country. No changes in diet.  Renal: No UTIs, No hematuria Genital: No drainage, bleeding, masses Musculoskeletal: No severe joint pain. Good ROM major joints Skin: No sores or lesions Heme/Lymph: No easy bleeding. No swollen lymph nodes Neuro: No active seizures. No facial droop Psych: No hallucinations. No agitation  OBJECTIVE   Vitals:  06/01/22 1154  BP: 126/86  Pulse: 110  Temp: 36.7 C (98 F)  SpO2: 99%  Weight: 49.8 kg (109 lb 12.8 oz)  Height: 160 cm ('5\' 3"'$ )   Body mass index is 19.45 kg/m.  PHYSICAL EXAM:  Constitutional: Not cachectic. Hygeine adequate. Vitals signs as above.  Eyes: No glasses. Vision adequate,Pupils reactive, normal extraocular movements. Sclera nonicteric Neuro: CN II-XII intact. No major focal sensory defects. No major motor deficits. Lymph: No head/neck/groin lymphadenopathy Psych: No severe agitation. No severe anxiety. Judgment & insight Adequate, Oriented x4, HENT: Normocephalic, Mucus membranes moist. No thrush. Hearing: adequate Neck: Supple, No tracheal deviation. No obvious thyromegaly Chest: No pain to chest wall compression. Good respiratory excursion. No audible wheezing CV: Pulses intact. regular. No major extremity edema Ext: No obvious deformity or contracture. Edema: Not present. No cyanosis Skin: No major subcutaneous nodules. Warm and dry Musculoskeletal: Severe joint rigidity not present. No obvious clubbing. No digital petechiae. Mobility: no assist device moving easily without restrictions  Abdomen: Flat Soft. Nondistended. Nontender. Hernia: Not present. Diastasis recti: Not present. No hepatomegaly. No splenomegaly.  Genital/Pelvic: Inguinal hernia: Not present.  Inguinal lymph nodes: without lymphadenopathy nor hidradenitis.   Rectal: (Deferred)    ###################################################################  Labs, Imaging and Diagnostic Testing:  Located in 'Care Everywhere' section of Epic EMR chart  PRIOR [facility] CLINIC NOTES:  Not applicable  SURGERY NOTES:  Not applicable  PATHOLOGY:  Located in [place]' section of Epic EMR chart  Assessment and Plan:  DIAGNOSES:  Diagnoses and all orders for this visit:  Primary cancer of hepatic flexure of colon (CMS-HCC)  Iron deficiency anemia due to chronic blood loss  Other orders - polyethylene glycol (MIRALAX) powder; Take 233.75 g by mouth once for 1 dose Take according to your procedure prep instructions. - bisacodyL (DULCOLAX) 5 mg EC tablet; Take 4 tablets (20 mg total) by mouth once daily as needed for Constipation for up to 1 dose - metroNIDAZOLE (FLAGYL) 500 MG tablet; Take 2 tablets (1,000 mg total) by mouth 3 (three) times daily for 3 doses SEE BOWEL PREP INSTRUCTIONS: Take 2 tablets at 2pm, 3pm, and 10pm the day prior to your colon operation. - neomycin 500 mg tablet; Take 2 tablets (1,000 mg total) by mouth 3 (three) times daily for 3 doses SEE BOWEL PREP INSTRUCTIONS: Take 2 tablets at 2pm, 3pm, and 10pm the day prior to your colon operation.    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  CAT scan rules out any evidence of metastatic disease.  I think she would benefit from segmental colonic resection. She wishes to be aggressive and go for cure as well. Reasonable  candidate for a minimal invasive robotic approach with intracorporeal anastomosis. Despite her advanced age, she looks remarkably healthy and vigorous so hopefully she will get through it safely. She has been evaluated by Dr. Sharlet Salina with internal medicine.  I do not sense any major cardiac concerns that would warrant clearance. Because she is having some bowel changes I do not think this can wait much longer. We will try and urgently set up for colectomy. She is interested in proceeding. We will work to coordinate a convenient time.  The anatomy & physiology of the digestive tract was discussed. The pathophysiology of the colon was discussed. Natural history risks without surgery was discussed. I feel the risks of no intervention will lead to serious problems that outweigh the operative risks; therefore, I recommended a partial colectomy to remove the pathology. Minimally invasive (Robotic/Laparoscopic) & open techniques were discussed.   Risks such as bleeding, infection, abscess, leak, reoperation, injury to other organs, need for repair of tissues / organs, possible ostomy, hernia, heart attack, stroke, death, and other risks were discussed. I noted a good likelihood this will help address the problem. Goals of post-operative recovery were discussed as well. Need for adequate nutrition, daily bowel regimen and healthy physical activity, to optimize recovery was noted as well. We will work to minimize complications. Educational materials were available as well. Questions were answered. The patient expresses understanding & wishes to proceed with surgery.  She noted that her friend and will be there to help out. Her daughter is most likely flying down to help recover from surgery. Patient warned me that her daughter will most likely want to be very involved and ask a lot of questions, but the patient wants to run the show.   Adin Hector, MD, FACS, MASCRS Esophageal, Gastrointestinal & Colorectal Surgery Robotic and Minimally Invasive Surgery  [facility] [place]  [place] [address] [place], [place] [address] [phone] Fax [phone] Main  CONTACT INFORMATION:  Weekday  (9AM-5PM): Call [facility] main office at [phone]  Weeknight (5PM-9AM) or Weekend/Holiday: Check [email or web address] (password " [email or web address]") for General Surgery [facility] coverage  (Please, do not use SecureChat as it is not reliable communication to operating surgeons for immediate patient care)    06/17/2022

## 2022-06-17 NOTE — Op Note (Signed)
06/17/2022  4:27 PM  PATIENT:  Ann [name]  87 y.o. female  Patient Care Team: Hoyt Koch, MD as PCP - General (Internal Medicine) [facility] Doran Stabler, MD as Consulting Physician (Gastroenterology) Michael Boston, MD as Consulting Physician (General Surgery) Zadie Rhine Clent Demark, MD as Consulting Physician (Ophthalmology)  PRE-OPERATIVE DIAGNOSIS:  Cancer of Hepatic Flexure of Colon  POST-OPERATIVE DIAGNOSIS:  Cancer of Hepatic Flexure of Colon  PROCEDURE:   ROBOTIC PROXIMAL RIGHT COLECTOMY ASSESSMENT OF TISSUE PERFUSION VIA FIREFLY INJECTION TRANSVERSUS ABDOMINIS PLANE (TAP) BLOCK - BILATERAL  SURGEON:  Adin Hector, MD  ASSISTANT: Leighton Ruff, MD, FACS, FASCRS An experienced assistant was required given the standard of surgical care given the complexity of the case.  This assistant was needed for exposure, dissection, suction, tissue approximation, retraction, perception, etc.  ANESTHESIA:     General  Regional TRANSVERSUS ABDOMINIS PLANE (TAP) nerve block for perioperative & postoperative pain control provided with liposomal bupivacaine (Experel) mixed with 0.25% bupivacaine as a Bilateral TAP block x 87m each side at the level of the transverse abdominis & preperitoneal spaces along the flank at the anterior axillary line, from subcostal ridge to iliac crest under laparoscopic guidance   Local field block at port sites & extraction wound  EBL:  Total I/O In: 1400 [I.V.:1300; IV Piggyback:100] Out: 25 [Blood:25]  Delay start of Pharmacological VTE agent (>24hrs) due to surgical blood loss or risk of bleeding:  no  DRAINS: none   SPECIMEN:    DISPOSITION OF SPECIMEN:  PATHOLOGY  COUNTS:  YES  PLAN OF CARE: Admit to inpatient   PATIENT DISPOSITION:  PACU - hemodynamically stable.  INDICATION:    Elderly but very active woman with obvious mass in hepatic flexure.  Near obstructing.  Biopsy consistent with adenocarcinoma.   I recommended segmental resection:  The anatomy & physiology of the digestive tract was discussed.  The pathophysiology was discussed.  Natural history risks without surgery was discussed.   I worked to give an overview of the disease and the frequent need to have multispecialty involvement.  I feel the risks of no intervention will lead to serious problems that outweigh the operative risks; therefore, I recommended a partial colectomy to remove the pathology.  Laparoscopic & open techniques were discussed.   Risks such as bleeding, infection, abscess, leak, reoperation, possible ostomy, hernia, heart attack, death, and other risks were discussed.  I noted a good likelihood this will help address the problem.   Goals of post-operative recovery were discussed as well.  We will work to minimize complications.  An educational handout on the pathology was given as well.  Questions were answered.    The patient expresses understanding & wishes to proceed with surgery.  OR FINDINGS:   Patient had bulky tumor at hepatic flexure with a sending colon to proximal transverse colon adhesions. No obvious metastatic disease on visceral parietal peritoneum or liver.  It is an isoperistaltic ileocolonic anastomosis that rests in the periumbilical region.  CASE DATA:  Type of patient?: Elective [facility] Private Case  Status of Case? Elective Scheduled  Infection Present At Time Of Surgery (PATOS)?  NO    DESCRIPTION:   Informed consent was confirmed.  The patient underwent general anaesthesia without difficulty.  The patient was positioned with arms tucked & secured appropriately.  VTE prevention in place.  The patient's abdomen was clipped, prepped, & draped in a sterile fashion.  Surgical timeout confirmed our plan.  The patient was  positioned in reverse Trendelenburg.  Abdominal entry was gained using Varess technique at the left subcostal ridge on the anterior abdominal wall.  No elevated EtCO2 noted.  Port  placed.  Camera inspection revealed no injury.  Extra ports were carefully placed under direct laparoscopic visualization.  We docked the [employer] Vinci robot carefully and placed intstruments under visualization  Patient had moderate omental adhesions on the right side especially the right upper quadrant.  We freed this off the anterior abdominal wall to get a better sense anatomy and mobility.  Could clearly see tattooing in the distal ascending and proximal transverse colon and fullness and thickening consistent with a tumor that correlated with colonoscopy and CT scan.  I mobilized & reflected the greater omentum in the upper abdomen.  Position the small bowel into the left lower quadrant and pelvis.  I was able to elevate the proximal colon to isolate the ileocolonic pedicle.  I scored the ileal mesentery just proximal to that.   I carried that further dissection in a medial to lateral fashion.  I was able to bluntly get into the retro-mesenteric plane on the right side.  I freed the proximal right sided colonic mesentery off the retroperitoneum including the duodenal sweep, pancreatic head, & Gerota's fascia of the right kidney. I was able to get underneath the hepatic flexure.  I was able to get underneath the proximal and mid transverse colon.  I isolated the proximal ileocecal pedicle.  I skeletonized it & transected the vessels.  Elevated the mid transverse colon and found the dominant right middle colic arterial pedicle.  Transected through the transverse colon mesentery was able to get the lesser sac there  I then proceeded to mobilize the terminal ileum & proximal "right" colon in a lateral to medial fashion.  I mobilized the distal ileal mesentery off its retroperitoneal and pelvic attachments.  I mobilized the ascending colon off It is side wall attachments to the paracolic gutter and retroperitoneum.  I also mobilized the greater omentum off the mid transverse colon and mobilized the mid to  proximal transverse colon in a superior to inferior fashion.  There is some thickening omental adhesions to the tumor so those were left on en bloc.  This allowed me to mobilize the hepatic flexure and get a complete mobilization of the proximal "right" colon to the mid-transverse colon.   I could isolate the pathology.   Chose an area in the distal ileum and transected the mesentery radially chose an area in the transverse colon just proximal to a dominant middle colic artery pedicle and transected that in a radial fashion to good location.  We confirmed good viability of the ileum and transverse colon plan for anastomosis. To access vascular perfusion of tissues, we asked anesthesia use intravenous  indocyanine green (ICG) with IV flush.  I switched to the NIR fluorescence (Firefly mode) imaging window on the daVinci robot platform.  We were able to see good light green visualization of blood vessels with good vascular perfusion of tissues, confirming good tissue perfusion of tissues (terminal ileum and proximal mid transverse colon) planned for anastomosis.  When he went ahead and proceeded with transection.  We transected at the distal ileum with a robotic stapler 39m blue load.  We then transected transverse colon with a robotic stapler 645mblue load.  We confirmed hemostasis.    I did a side-to-side stapled anastomosis of ileum to mid-transverse colon using a 6055mhite load in an isoperistaltic fashion.  (Distal  stump of ileum to mid transverse colon for the distal end of the anastomosis.  Proximal end of colon stump to more proximal ileum for the proximal end of the anastomosis).  I sewed the common staple channel wound with an absorbable suture (V-lock) in a running [place] fashion from each corner and meeting in the center.  I did meticulous inspection prove an airtight closure.  I protected the anastomosis line with an anterior omentopexy of greater omentum using V lock suture.    We did  reinspection of the abdomen.  Hemostasis was good.   Ureters, retroperitoneum, and bowel uninjured.  The anastomosis looked healthy.   Endoluminal gas was evacuated.  We placed the wound protector through the suprapubic 94m port site after it was enlarged in a Pfannenstiel fashion.  Specimen removed without incident.   Ports & wound protector removed.  Hemostasis was good.  Sterile unused instruments were used from this point.  I closed the skin at the port sites using Monocryl stitch and sterile dressing.  I closed the extraction wound using a 0 Vicryl vertical peritoneal closure and a #1 PDS transverse anterior rectal fascial closure like a small Pfannenstiel closure. I closed the skin with some interrupted Monocryl stitches.  I placed sterile dressings.     Patient is being extubated go to recovery room. I discussed postop care with the patient in detail the office & in the holding area. Instructions are written. I made an attempt to locate & reach the desired patient contact to discuss the patient's overall status and my recommendations.  Called the patient's daughter at her home phone and her mobile phone twice.  Called surgical waiting with no answer.  We will try again later.  No one is available at this time.  My plans & instructions have been written in the chart.  SAdin Hector M.D., F.A.C.S. Gastrointestinal and Minimally Invasive Surgery [facility] [id number] [place] [phone] / Paging

## 2022-06-17 NOTE — Anesthesia Postprocedure Evaluation (Signed)
Anesthesia Post Note  Patient: Ann [name]  Procedure(s) Performed: XI ROBOT ASSISTED RIGHT COLECTOMY, ASSESSNENT OF TISSUE PERFUSSION VIA FIREFLY INJECTION, BILATERAL TAP BLOCK (Right)     Patient location during evaluation: PACU Anesthesia Type: General Level of consciousness: awake and alert Pain management: pain level controlled Vital Signs Assessment: post-procedure vital signs reviewed and stable Respiratory status: spontaneous breathing, nonlabored ventilation, respiratory function stable and patient connected to nasal cannula oxygen Cardiovascular status: blood pressure returned to baseline and stable Postop Assessment: no apparent nausea or vomiting Anesthetic complications: no   No notable events documented.  Last Vitals:  Vitals:   06/17/22 1715 06/17/22 1730  BP: (!) 164/68 (!) 158/65  Pulse: 66 61  Resp: 18 16  Temp:  36.5 C  SpO2: 100% 100%    Last Pain:  Vitals:   06/17/22 1730  TempSrc:   PainSc: 0-No pain                 Santa Lighter

## 2022-06-17 NOTE — Discharge Instructions (Addendum)
SURGERY: POST OP INSTRUCTIONS (Surgery for small bowel obstruction, colon resection, etc)   ######################################################################  EAT Gradually transition to a high fiber diet with a fiber supplement over the next few days after discharge  WALK Walk an hour a day.  Control your pain to do that.    CONTROL PAIN Control pain so that you can walk, sleep, tolerate sneezing/coughing, go up/down stairs.  HAVE A BOWEL MOVEMENT DAILY Keep your bowels regular to avoid problems.  OK to try a laxative to override constipation.  OK to use an antidairrheal to slow down diarrhea.  Call if not better after 2 tries  CALL IF YOU HAVE PROBLEMS/CONCERNS Call if you are still struggling despite following these instructions. Call if you have concerns not answered by these instructions  ######################################################################   DIET Follow a light diet the first few days at home.  Start with a bland diet such as soups, liquids, starchy foods, low fat foods, etc.  If you feel full, bloated, or constipated, stay on a ful liquid or pureed/blenderized diet for a few days until you feel better and no longer constipated. Be sure to drink plenty of fluids every day to avoid getting dehydrated (feeling dizzy, not urinating, etc.). Gradually add a fiber supplement to your diet over the next week.  Gradually get back to a regular solid diet.  Avoid fast food or heavy meals the first week as you are more likely to get nauseated. It is expected for your digestive tract to need a few months to get back to normal.  It is common for your bowel movements and stools to be irregular.  You will have occasional bloating and cramping that should eventually fade away.  Until you are eating solid food normally, off all pain medications, and back to regular activities; your bowels will not be normal. Focus on eating a low-fat, high fiber diet the rest of your life  (See Getting to [place], below).  Reasonable to take the metoclopramide to help if you are having issues with nighttime spitting up/ emesis.  Take an over-the-counter antiacid medication such as Prilosec OTC or Tums would help.  Sleeping upright will help as well.  Should gradually improve.  CARE of your INCISION or WOUND  It is good for closed incisions and even open wounds to be washed every day.  Shower every day.  Short baths are fine.  Wash the incisions and wounds clean with soap & water.    You may leave closed incisions open to air if it is dry.   You may cover the incision with clean gauze & replace it after your daily shower for comfort.  TEGADERM:  You have clear gauze band-aid dressings over your closed incision(s).  Remove the dressings 3 days after surgery.    If you have an open wound with a wound vac, see wound vac care instructions.    ACTIVITIES as tolerated Start light daily activities --- self-care, walking, climbing stairs-- beginning the day after surgery.  Gradually increase activities as tolerated.  Control your pain to be active.  Stop when you are tired.  Ideally, walk several times a day, eventually an hour a day.   Most people are back to most day-to-day activities in a few weeks.  It takes 4-8 weeks to get back to unrestricted, intense activity. If you can walk 30 minutes without difficulty, it is safe to try more intense activity such as jogging, treadmill, bicycling, low-impact aerobics, swimming, etc. Save the most  intensive and strenuous activity for last (Usually 4-8 weeks after surgery) such as sit-ups, heavy lifting, contact sports, etc.  Refrain from any intense heavy lifting or straining until you are off narcotics for pain control.  You will have off days, but things should improve week-by-week. DO NOT PUSH THROUGH PAIN.  Let pain be your guide: If it hurts to do something, don't do it.  Pain is your body warning you to avoid that activity for  another week until the pain goes down. You may drive when you are no longer taking narcotic prescription pain medication, you can comfortably wear a seatbelt, and you can safely make sudden turns/stops to protect yourself without hesitating due to pain. You may have sexual intercourse when it is comfortable. If it hurts to do something, stop.  MEDICATIONS Take your usually prescribed home medications unless otherwise directed.   Blood thinners:  Usually you can restart any strong blood thinners after the second postoperative day.  It is OK to take aspirin right away.     If you are on strong blood thinners (warfarin/Coumadin, Plavix, Xerelto, Eliquis, Pradaxa, etc), discuss with your surgeon, medicine PCP, and/or cardiologist for instructions on when to restart the blood thinner & if blood monitoring is needed (PT/INR blood check, etc).     PAIN CONTROL Pain after surgery or related to activity is often due to strain/injury to muscle, tendon, nerves and/or incisions.  This pain is usually short-term and will improve in a few months.  To help speed the process of healing and to get back to regular activity more quickly, DO THE FOLLOWING THINGS TOGETHER: Increase activity gradually.  DO NOT PUSH THROUGH PAIN Use Ice and/or Heat Try Gentle Massage and/or Stretching Take over the counter pain medication Take Narcotic prescription pain medication for more severe pain  Good pain control = faster recovery.  It is better to take more medicine to be more active than to stay in bed all day to avoid medications.  Increase activity gradually Avoid heavy lifting at first, then increase to lifting as tolerated over the next 6 weeks. Do not "push through" the pain.  Listen to your body and avoid positions and maneuvers than reproduce the pain.  Wait a few days before trying something more intense Walking an hour a day is encouraged to help your body recover faster and more safely.  Start slowly and stop  when getting sore.  If you can walk 30 minutes without stopping or pain, you can try more intense activity (running, jogging, aerobics, cycling, swimming, treadmill, sex, sports, weightlifting, etc.) Remember: If it hurts to do it, then don't do it! Use Ice and/or Heat You will have swelling and bruising around the incisions.  This will take several weeks to resolve. Ice packs or heating pads (6-8 times a day, 30-60 minutes at a time) will help sooth soreness & bruising. Some people prefer to use ice alone, heat alone, or alternate between ice & heat.  Experiment and see what works best for you.  Consider trying ice for the first few days to help decrease swelling and bruising; then, switch to heat to help relax sore spots and speed recovery. Shower every day.  Short baths are fine.  It feels good!  Keep the incisions and wounds clean with soap & water.   Try Gentle Massage and/or Stretching Massage at the area of pain many times a day Stop if you feel pain - do not overdo it Take over the counter pain  medication This helps the muscle and nerve tissues become less irritable and calm down faster Choose ONE of the following over-the-counter anti-inflammatory medications: Acetaminophen '500mg'$  tabs (Tylenol) 1-2 pills with every meal and just before bedtime (avoid if you have liver problems or if you have acetaminophen in you narcotic prescription) Naproxen '220mg'$  tabs (ex. Aleve, Naprosyn) 1-2 pills twice a day (avoid if you have kidney, stomach, IBD, or bleeding problems) Ibuprofen '200mg'$  tabs (ex. Advil, Motrin) 3-4 pills with every meal and just before bedtime (avoid if you have kidney, stomach, IBD, or bleeding problems) Take with food/snack several times a day as directed for at least 2 weeks to help keep pain / soreness down & more manageable. Take Narcotic prescription pain medication for more severe pain A prescription for strong pain control is often given to you upon discharge (for example:  oxycodone/Percocet, hydrocodone/Norco/Vicodin, or tramadol/Ultram) Take your pain medication as prescribed. Be mindful that most narcotic prescriptions contain Tylenol (acetaminophen) as well - avoid taking too much Tylenol. If you are having problems/concerns with the prescription medicine (does not control pain, nausea, vomiting, rash, itching, etc.), please call us [phone] to see if we need to switch you to a different pain medicine that will work better for you and/or control your side effects better. If you need a refill on your pain medication, you must call the office before 4 pm and on weekdays only.  By federal law, prescriptions for narcotics cannot be called into a pharmacy.  They must be filled out on paper & picked up from our office by the patient or authorized caretaker.  Prescriptions cannot be filled after 4 pm nor on weekends.    WHEN TO CALL us [phone] Severe uncontrolled or worsening pain  Fever over 101 F (38.5 C) Concerns with the incision: Worsening pain, redness, rash/hives, swelling, bleeding, or drainage Reactions / problems with new medications (itching, rash, hives, nausea, etc.) Nausea and/or vomiting Difficulty urinating Difficulty breathing Worsening fatigue, dizziness, lightheadedness, blurred vision Other concerns If you are not getting better after two weeks or are noticing you are getting worse, contact our office [phone] for further advice.  We may need to adjust your medications, re-evaluate you in the office, send you to the emergency room, or see what other things we can do to help. The clinic staff is available to answer your questions during regular business hours (8:30am-5pm).  Please don't hesitate to call and ask to speak to one of our nurses for clinical concerns.    A surgeon from [facility] is always on call at the hospitals 24 hours/day If you have a medical emergency, go to the nearest emergency room or call  911.  FOLLOW UP in our office One the day of your discharge from the hospital (or the next business weekday), please call [facility] to set up or confirm an appointment to see your surgeon in the office for a follow-up appointment.  Usually it is 2-3 weeks after your surgery.   If you have skin staples at your incision(s), let the office know so we can set up a time in the office for the nurse to remove them (usually around 10 days after surgery). Make sure that you call for appointments the day of discharge (or the next business weekday) from the hospital to ensure a convenient appointment time. IF YOU HAVE DISABILITY OR FAMILY LEAVE FORMS, BRING THEM TO THE OFFICE FOR PROCESSING.  DO NOT GIVE THEM TO YOUR DOCTOR.  [facility] [address], [place], [place], [place]  [address] ? [phone] - Main [phone] - [place],  [phone] - Fax [email or web address]    GETTING TO GOOD BOWEL HEALTH. It is expected for your digestive tract to need a few months to get back to normal.  It is common for your bowel movements and stools to be irregular.  You will have occasional bloating and cramping that should eventually fade away.  Until you are eating solid food normally, off all pain medications, and back to regular activities; your bowels will not be normal.   Avoiding constipation The goal: ONE SOFT BOWEL MOVEMENT A DAY!    Drink plenty of fluids.  Choose water first. TAKE A FIBER SUPPLEMENT EVERY DAY THE REST OF YOUR LIFE During your first week back home, gradually add back a fiber supplement every day Experiment which form you can tolerate.   There are many forms such as powders, tablets, wafers, gummies, etc Psyllium bran (Metamucil), methylcellulose (Citrucel), Miralax or Glycolax, Benefiber, Flax Seed.  Adjust the dose week-by-week (1/2 dose/day to 6 doses a day) until you are moving your bowels 1-2 times a day.  Cut back the dose or  try a different fiber product if it is giving you problems such as diarrhea or bloating. Sometimes a laxative is needed to help jump-start bowels if constipated until the fiber supplement can help regulate your bowels.  If you are tolerating eating & you are farting, it is okay to try a gentle laxative such as double dose MiraLax, prune juice, or Milk of Magnesia.  Avoid using laxatives too often. Stool softeners can sometimes help counteract the constipating effects of narcotic pain medicines.  It can also cause diarrhea, so avoid using for too long. If you are still constipated despite taking fiber daily, eating solids, and a few doses of laxatives, call our office. Controlling diarrhea Try drinking liquids and eating bland foods for a few days to avoid stressing your intestines further. Avoid dairy products (especially milk & ice cream) for a short time.  The intestines often can lose the ability to digest lactose when stressed. Avoid foods that cause gassiness or bloating.  Typical foods include beans and other legumes, cabbage, broccoli, and dairy foods.  Avoid greasy, spicy, fast foods.  Every person has some sensitivity to other foods, so listen to your body and avoid those foods that trigger problems for you. Probiotics (such as active yogurt, Align, etc) may help repopulate the intestines and colon with normal bacteria and calm down a sensitive digestive tract Adding a fiber supplement gradually can help thicken stools by absorbing excess fluid and retrain the intestines to act more normally.  Slowly increase the dose over a few weeks.  Too much fiber too soon can backfire and cause cramping & bloating. It is okay to try and slow down diarrhea with a few doses of antidiarrheal medicines.   Bismuth subsalicylate (ex. Kayopectate, Pepto Bismol) for a few doses can help control diarrhea.  Avoid if pregnant.   Loperamide (Imodium) can slow down diarrhea.  Start with one tablet ('2mg'$ ) first.  Avoid if  you are having fevers or severe pain.  ILEOSTOMY PATIENTS WILL HAVE CHRONIC DIARRHEA since their colon is not in use.    Drink plenty of liquids.  You will need to drink even more glasses of water/liquid a day to avoid getting dehydrated. Record output from your ileostomy.  Expect to empty the bag every 3-4 hours  at first.  Most people with a permanent ileostomy empty their bag 4-6 times at the least.   Use antidiarrheal medicine (especially Imodium) several times a day to avoid getting dehydrated.  Start with a dose at bedtime & breakfast.  Adjust up or down as needed.  Increase antidiarrheal medications as directed to avoid emptying the bag more than 8 times a day (every 3 hours). Work with your wound ostomy nurse to learn care for your ostomy.  See ostomy care instructions. TROUBLESHOOTING IRREGULAR BOWELS 1) Start with a soft & bland diet. No spicy, greasy, or fried foods.  2) Avoid gluten/wheat or dairy products from diet to see if symptoms improve. 3) Miralax 17gm or flax seed mixed in [place]. water or juice-daily. May use 2-4 times a day as needed. 4) Gas-X, Phazyme, etc. as needed for gas & bloating.  5) Prilosec (omeprazole) over-the-counter as needed 6)  Consider probiotics (Align, Activa, etc) to help calm the bowels down  Call your doctor if you are getting worse or not getting better.  Sometimes further testing (cultures, endoscopy, X-ray studies, CT scans, bloodwork, etc.) may be needed to help diagnose and treat the cause of the diarrhea. [facility], [place], [place], [place], [place]  [address] [phone] - Main.    [phone]  - Toll Free.   [phone] - Fax [email or web address]

## 2022-06-17 NOTE — Transfer of Care (Signed)
Immediate Anesthesia Transfer of Care Note  Patient: Ann [name]  Procedure(s) Performed: XI ROBOT ASSISTED RIGHT COLECTOMY, ASSESSNENT OF TISSUE PERFUSSION VIA FIREFLY INJECTION, BILATERAL TAP BLOCK (Right)  Patient Location: PACU  Anesthesia Type:General  Level of Consciousness: awake, alert  and oriented  Airway & Oxygen Therapy: Patient Spontanous Breathing and Patient connected to face mask oxygen  Post-op Assessment: Report given to RN and Post -op Vital signs reviewed and stable  Post vital signs: Reviewed and stable  Last Vitals:  Vitals Value Taken Time  BP 160/70 06/17/22 1636  Temp    Pulse 83 06/17/22 1638  Resp 21 06/17/22 1638  SpO2 100 % 06/17/22 1638  Vitals shown include unvalidated device data.  Last Pain:  Vitals:   06/17/22 1202  TempSrc:   PainSc: 0-No pain         Complications: No notable events documented.

## 2022-06-18 ENCOUNTER — Other Ambulatory Visit (HOSPITAL_COMMUNITY): Payer: Self-pay

## 2022-06-18 LAB — CBC
HCT: 26.4 % — ABNORMAL LOW (ref 36.0–46.0)
Hemoglobin: 8.3 g/dL — ABNORMAL LOW (ref 12.0–15.0)
MCH: 26.9 pg (ref 26.0–34.0)
MCHC: 31.4 g/dL (ref 30.0–36.0)
MCV: 85.4 fL (ref 80.0–100.0)
Platelets: 250 10*3/uL (ref 150–400)
RBC: 3.09 MIL/uL — ABNORMAL LOW (ref 3.87–5.11)
RDW: 22.8 % — ABNORMAL HIGH (ref 11.5–15.5)
WBC: 12 10*3/uL — ABNORMAL HIGH (ref 4.0–10.5)
nRBC: 0 % (ref 0.0–0.2)

## 2022-06-18 LAB — BASIC METABOLIC PANEL
Anion gap: 8 (ref 5–15)
BUN: 16 mg/dL (ref 8–23)
CO2: 23 mmol/L (ref 22–32)
Calcium: 7.8 mg/dL — ABNORMAL LOW (ref 8.9–10.3)
Chloride: 104 mmol/L (ref 98–111)
Creatinine, Ser: 1.49 mg/dL — ABNORMAL HIGH (ref 0.44–1.00)
GFR, Estimated: 34 mL/min — ABNORMAL LOW (ref >60)
Glucose, Bld: 96 mg/dL (ref 70–99)
Potassium: 3.4 mmol/L — ABNORMAL LOW (ref 3.5–5.1)
Sodium: 135 mmol/L (ref 135–145)

## 2022-06-18 LAB — MAGNESIUM: Magnesium: 2 mg/dL (ref 1.7–2.4)

## 2022-06-18 MED ORDER — SODIUM CHLORIDE 0.9% FLUSH: 3.0000 mL | INTRAVENOUS | Status: DC | PRN

## 2022-06-18 MED ORDER — ENSURE ENLIVE PO LIQD
237.0000 mL | Freq: Two times a day (BID) | ORAL | Status: DC
  Administered 2022-06-18 – 2022-06-19 (×2): 237 mL via ORAL

## 2022-06-18 MED ORDER — POTASSIUM CHLORIDE CRYS ER 20 MEQ PO TBCR
40.0000 meq | EXTENDED_RELEASE_TABLET | Freq: Every day | ORAL | Status: AC
  Administered 2022-06-18 – 2022-06-20 (×3): 40 meq via ORAL
  Filled 2022-06-18 (×3): qty 2

## 2022-06-18 MED ORDER — SODIUM CHLORIDE 0.9 % IV SOLN: 250.0000 mL | INTRAVENOUS | Status: DC | PRN

## 2022-06-18 MED ORDER — ENOXAPARIN SODIUM 30 MG/0.3ML IJ SOSY
30.0000 mg | PREFILLED_SYRINGE | INTRAMUSCULAR | Status: DC
  Administered 2022-06-19 – 2022-06-20 (×2): 30 mg via SUBCUTANEOUS
  Filled 2022-06-18 (×2): qty 0.3

## 2022-06-18 MED ORDER — LACTATED RINGERS IV BOLUS: 1000.0000 mL | Freq: Three times a day (TID) | INTRAVENOUS | Status: AC | PRN

## 2022-06-18 MED ORDER — SODIUM CHLORIDE 0.9% FLUSH
3.0000 mL | Freq: Two times a day (BID) | INTRAVENOUS | Status: DC
  Administered 2022-06-18 – 2022-06-23 (×7): 3 mL via INTRAVENOUS

## 2022-06-18 NOTE — Progress Notes (Signed)
Initial Nutrition Assessment  INTERVENTION:   -Ensure Plus High Protein po BID, each supplement provides 350 kcal and 20 grams of protein.   NUTRITION DIAGNOSIS:   Increased nutrient needs related to cancer and cancer related treatments as evidenced by estimated needs.  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  Patient in room, family and friend at bedside.  Pt reports tolerating full liquids this morning (grits and ice cream). Just finished her soup and was about to eat her ice cream.  Pt reports steadily decreasing appetite PTA. Per friend at bedside, pt normally would eat during social functions but then would stop eating at them all together. Pt denies issues with chewing or swallowing.  Is willing to try Ensure but does not seem enthusiastic about it.   Pt reports UBW of 140 lbs. Current weight: 110 lbs.  Medications: OSCAL, Fibercon, KLOR-CON  Labs reviewed:  Low K  NUTRITION - FOCUSED PHYSICAL EXAM:  Pt declines, wants to eat her ice cream.  Diet Order:   Diet Order             Diet full liquid Room service appropriate? Yes; Fluid consistency: Thin  Diet effective now           Diet - low sodium heart healthy                   EDUCATION NEEDS:   No education needs have been identified at this time  Skin:  Skin Assessment: Skin Integrity Issues: Skin Integrity Issues:: Incisions Incisions: 6/29 abdomen  Last BM:  PTA  Height:   Ht Readings from Last 1 Encounters:  06/17/22 '5\' 3"'$  (1.6 m)    Weight:   Wt Readings from Last 1 Encounters:  06/17/22 50.1 kg    BMI:  Body mass index is 19.55 kg/m.  Estimated Nutritional Needs:   Kcal:  1500-1700  Protein:   75-85g  Fluid:  1.7L/day  Clayton Bibles, MS, RD, LDN Inpatient Clinical Dietitian Contact information available via Amion

## 2022-06-18 NOTE — Progress Notes (Signed)
  Transition of Care [facility]) Screening Note   Patient Details  Name: Ann [name] Date of Birth: 1935/01/06   Transition of Care [facility]) CM/SW Contact:    Vassie Moselle, LCSW Phone Number: 06/18/2022, 11:00 AM    Transition of Care Department [facility]) has reviewed patient and no TOC needs have been identified at this time. We will continue to monitor patient advancement through interdisciplinary progression rounds. If new patient transition needs arise, please place a TOC consult.

## 2022-06-18 NOTE — Progress Notes (Signed)
Ann [name] [id number] 1934/12/30  CARE TEAM:  PCP: Hoyt Koch, MD  Outpatient Care Team: Patient Care Team: Hoyt Koch, MD as PCP - General (Internal Medicine) [facility] Doran Stabler, MD as Consulting Physician (Gastroenterology) Michael Boston, MD as Consulting Physician (General Surgery) Zadie Rhine Clent Demark, MD as Consulting Physician (Ophthalmology)  Inpatient Treatment Team: Treatment Team: Attending Provider: Michael Boston, MD; Charge Nurse: Heloise Ochoa, RN; Technician: Constance Goltz, NT   Problem List:   Principal Problem:   Colon cancer ([place])   1 Day Post-Op  06/17/2022  POST-OPERATIVE DIAGNOSIS:  Cancer of Hepatic Flexure of Colon   PROCEDURE:   ROBOTIC PROXIMAL RIGHT COLECTOMY ASSESSMENT OF TISSUE PERFUSION VIA FIREFLY INJECTION TRANSVERSUS ABDOMINIS PLANE (TAP) BLOCK - BILATERAL   SURGEON:  Adin Hector, MD  OR FINDINGS:    Patient had bulky tumor at hepatic flexure with a sending colon to proximal transverse colon adhesions. No obvious metastatic disease on visceral parietal peritoneum or liver.   It is an isoperistaltic ileocolonic anastomosis that rests in the periumbilical region.  Assessment  Recovering well so far  [facility] Stay = 1 days)  Plan:  -ERAS pathway.  Advance diet as tolerated.  [place] fluids.  Follow-up on pathology.  -VTE prophylaxis- SCDs, etc -mobilize as tolerated to help recovery  Disposition:  Disposition:  The patient is from: Home  Anticipate discharge to:  Home  Anticipated Date of Discharge is:  July 1,2023    Barriers to discharge:  Pending Clinical improvement (more likely than not)  Patient currently is NOT MEDICALLY STABLE for discharge from the hospital from a surgery standpoint.      I reviewed nursing notes, last 24 h vitals and pain scores, last 48 h intake and output, last 24 h labs and trends, and last 24 h imaging results. I have reviewed  this patient's available data, including medical history, events of note, test results, etc as part of my evaluation.  A significant portion of that time was spent in counseling.  Care during the described time interval was provided by me.  This care required moderate level of medical decision making.  06/18/2022    Subjective: (Chief complaint)  Patient feeling relatively well.  Tolerated liquids.  Walking hallways.  Denies much pain.  Objective:  Vital signs:  Vitals:   06/17/22 2120 06/17/22 2128 06/18/22 0148 06/18/22 0536  BP: (!) 111/54 (!) 117/55 (!) 109/52 (!) 110/54  Pulse: 64 65 71 71  Resp: '16 18 18 16  '$ Temp: 61.9 F (50.9 C)  (!) 97.3 F (36.3 C) 98.5 F (36.9 C)  TempSrc: Oral  Oral Oral  SpO2: 100% 100% 100% 100%  Weight:      Height:           Intake/Output   Yesterday:  06/29 0701 - 06/30 0700 In: 2344.2 [P.O.:480; I.V.:1667.1; IV Piggyback:197.1] Out: 1050 [Urine:950; Blood:100] This shift:  Total I/O In: 704.2 [P.O.:240; I.V.:367.1; IV Piggyback:97.1] Out: 950 [Urine:950]  Bowel function:  Flatus: YES  BM:  No  Drain: (No drain)   Physical Exam:  General: Pt awake/alert in no acute distress.  Sitting up in bed smiling.  Not toxic.  Not sickly.  Bright and alert. Eyes: PERRL, normal EOM.  Sclera clear.  No icterus Neuro: CN II-XII intact w/o focal sensory/motor deficits. Lymph: No head/neck/groin lymphadenopathy Psych:  No delerium/psychosis/paranoia.  Oriented x 4 HENT: Normocephalic, Mucus membranes moist.  No thrush Neck: Supple, No tracheal deviation.  No obvious thyromegaly Chest: No pain to chest wall compression.  Good respiratory excursion.  No audible wheezing CV:  Pulses intact.  Regular rhythm.  No major extremity edema MS: Normal AROM mjr joints.  No obvious deformity  Abdomen: Soft.  Nondistended.  Nontender.  No evidence of peritonitis.  No incarcerated hernias.  Ext:   No deformity.  No mjr edema.  No cyanosis Skin:  No petechiae / purpurea.  No major sores.  Warm and dry    Results:   Cultures: No results found for this or any previous visit (from the past 720 hour(s)).  Labs: Results for orders placed or performed during the hospital encounter of 06/17/22 (from the past 48 hour(s))  ABO/Rh     Status: None   Collection Time: 06/17/22 12:25 PM  Result Value Ref Range   ABO/RH(D)      O POS Performed at [facility], [place] [address] [place], [place] [address]   Basic metabolic panel     Status: Abnormal   Collection Time: 06/18/22  4:32 AM  Result Value Ref Range   Sodium 135 135 - 145 mmol/L   Potassium 3.4 (L) 3.5 - 5.1 mmol/L   Chloride 104 98 - 111 mmol/L   CO2 23 22 - 32 mmol/L   Glucose, Bld 96 70 - 99 mg/dL    Comment: Glucose reference range applies only to samples taken after fasting for at least 8 hours.   BUN 16 8 - 23 mg/dL   Creatinine, Ser 1.49 (H) 0.44 - 1.00 mg/dL   Calcium 7.8 (L) 8.9 - 10.3 mg/dL   GFR, Estimated 34 (L) >60 mL/min    Comment: (NOTE) Calculated using the CKD-EPI Creatinine Equation (2021)    Anion gap 8 5 - 15    Comment: Performed at [facility], [place] [address] [place], [place] [address]  CBC     Status: Abnormal   Collection Time: 06/18/22  4:32 AM  Result Value Ref Range   WBC 12.0 (H) 4.0 - 10.5 K/uL   RBC 3.09 (L) 3.87 - 5.11 MIL/uL   Hemoglobin 8.3 (L) 12.0 - 15.0 g/dL   HCT 26.4 (L) 36.0 - 46.0 %   MCV 85.4 80.0 - 100.0 fL   MCH 26.9 26.0 - 34.0 pg   MCHC 31.4 30.0 - 36.0 g/dL   RDW 22.8 (H) 11.5 - 15.5 %   Platelets 250 150 - 400 K/uL   nRBC 0.0 0.0 - 0.2 %    Comment: Performed at [facility], [place] [address] [place], [place] [address]  Magnesium     Status: None   Collection Time: 06/18/22  4:32 AM  Result Value Ref Range   Magnesium 2.0 1.7 - 2.4 mg/dL    Comment: Performed at [facility], [place] [address] [place], [place] [address]    Imaging /  Studies: No results found.  Medications / Allergies: per chart  Antibiotics: Anti-infectives (From admission, onward)    Start     Dose/Rate Route Frequency Ordered Stop   06/18/22 0200  cefoTEtan (CEFOTAN) 2 g in sodium chloride 0.9 % 100 mL IVPB        2 g 200 mL/hr over 30 Minutes Intravenous Every 12 hours 06/17/22 1754 06/18/22 0200   06/17/22 1400  neomycin (MYCIFRADIN) tablet 1,000 mg  Status:  Discontinued       See Hyperspace for full Linked Orders Report.   1,000 mg Oral 3 times  per day 06/17/22 1153 06/17/22 1159   06/17/22 1400  metroNIDAZOLE (FLAGYL) tablet 1,000 mg  Status:  Discontinued       See Hyperspace for full Linked Orders Report.   1,000 mg Oral 3 times per day 06/17/22 1153 06/17/22 1159   06/17/22 1200  cefoTEtan (CEFOTAN) 2 g in sodium chloride 0.9 % 100 mL IVPB        2 g 200 mL/hr over 30 Minutes Intravenous On call to O.R. 06/17/22 1153 06/17/22 1441         Note: Portions of this report may have been transcribed using voice recognition software. Every effort was made to ensure accuracy; however, inadvertent computerized transcription errors may be present.   Any transcriptional errors that result from this process are unintentional.    Adin Hector, MD, FACS, MASCRS Esophageal, Gastrointestinal & Colorectal Surgery Robotic and Minimally Invasive Surgery  [facility], [place]  [place]. [address] [place], [place] [address] [phone] Fax [phone] Main  CONTACT INFORMATION:  Weekday (9AM-5PM): Call [facility] main office at [phone]  Weeknight (5PM-9AM) or Weekend/Holiday: Check [email or web address] (password " [email or web address]") for General Surgery [facility] coverage  (Please, do not use SecureChat as it is not reliable communication to operating surgeons for immediate patient care)      06/18/2022  6:43 AM

## 2022-06-18 NOTE — Progress Notes (Signed)
Renal Dose Adjustment:  Patients CrCl = 22 mL/min, decrease enoxaparin to 30 mg subQ daily for correct DVT ppx dose for renal function.   Lenis Noon, PharmD 06/18/22 2:43 PM

## 2022-06-19 ENCOUNTER — Other Ambulatory Visit (HOSPITAL_COMMUNITY): Payer: Self-pay

## 2022-06-19 DIAGNOSIS — N182 Chronic kidney disease, stage 2 (mild): Secondary | ICD-10-CM

## 2022-06-19 DIAGNOSIS — E876 Hypokalemia: Secondary | ICD-10-CM

## 2022-06-19 DIAGNOSIS — N1832 Chronic kidney disease, stage 3b: Secondary | ICD-10-CM

## 2022-06-19 LAB — CREATININE, SERUM
Creatinine, Ser: 1.5 mg/dL — ABNORMAL HIGH (ref 0.44–1.00)
GFR, Estimated: 34 mL/min — ABNORMAL LOW (ref >60)

## 2022-06-19 LAB — HEMOGLOBIN: Hemoglobin: 8.4 g/dL — ABNORMAL LOW (ref 12.0–15.0)

## 2022-06-19 LAB — POTASSIUM: Potassium: 3.4 mmol/L — ABNORMAL LOW (ref 3.5–5.1)

## 2022-06-19 MED ORDER — LACTATED RINGERS IV BOLUS
1000.0000 mL | Freq: Once | INTRAVENOUS | Status: AC
  Administered 2022-06-19: 1000 mL via INTRAVENOUS

## 2022-06-19 MED ORDER — BISMUTH SUBSALICYLATE 262 MG/15ML PO SUSP
30.0000 mL | Freq: Two times a day (BID) | ORAL | Status: DC
  Administered 2022-06-19: 30 mL via ORAL
  Filled 2022-06-19: qty 236

## 2022-06-19 MED ORDER — ONDANSETRON HCL 4 MG PO TABS
4.0000 mg | ORAL_TABLET | Freq: Once | ORAL | Status: AC
  Administered 2022-06-19: 4 mg via ORAL
  Filled 2022-06-19: qty 1

## 2022-06-19 MED ORDER — LACTATED RINGERS IV BOLUS
1000.0000 mL | Freq: Three times a day (TID) | INTRAVENOUS | Status: DC | PRN
Start: 2022-06-19 — End: 2022-06-20

## 2022-06-19 MED ORDER — SIMETHICONE 80 MG PO CHEW
80.0000 mg | CHEWABLE_TABLET | Freq: Four times a day (QID) | ORAL | Status: AC
  Administered 2022-06-19 (×2): 80 mg via ORAL
  Filled 2022-06-19 (×3): qty 1

## 2022-06-19 MED ORDER — CALCIUM POLYCARBOPHIL 625 MG PO TABS
625.0000 mg | ORAL_TABLET | Freq: Every day | ORAL | Status: DC
  Administered 2022-06-19 – 2022-06-20 (×2): 625 mg via ORAL
  Filled 2022-06-19 (×2): qty 1

## 2022-06-19 NOTE — Progress Notes (Addendum)
Ann [name] [id number] 1935-02-15  CARE TEAM:  PCP: Hoyt Koch, MD  Outpatient Care Team: Patient Care Team: Hoyt Koch, MD as PCP - General (Internal Medicine) [facility] 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 No obvious metastatic disease on visceral parietal peritoneum or liver.   It is an isoperistaltic ileocolonic anastomosis that rests in the periumbilical region.  Assessment  Nausea and diarrhea.  [facility] Stay = 2 days)  Plan:  Not surprising she had loose bowel movements with the ileocecal valve gone.  Should be a temporary annoyance.  We will see.  -ERAS pathway.  Go back to count to clear liquids for now.  Simethicone scheduled and Zofran x1.  Encouraged her to consider more aggressive nausea control.  Lower frequency of fiber bowel regimen for now.  Follow-up continues IV fluids to give IV fluid bolus x1.  Probably has some  baseline chronic kidney disease.  Not oliguric.  Try and keep on the dry side from a colon standpoint but not so dry from a kidney standpoint.  Low potassium.  Replace and follow.    Follow-up on pathology.  -VTE prophylaxis- SCDs, etc -mobilize as tolerated to help recovery  Disposition:  Disposition:  The patient is from: Home  Anticipate discharge to:  Home  Anticipated Date of Discharge is:  July 3,2023    Barriers to discharge:  Pending Clinical improvement (more likely than not)  Patient currently is NOT MEDICALLY STABLE for discharge from the hospital from a surgery standpoint.      I reviewed nursing notes, last 24 h vitals and pain scores, last 48 h intake and output, last 24 h labs and trends, and last 24 h imaging results. I have reviewed this patient's available data, including medical history, events of note, test results, etc as part of my evaluation.  A significant portion of that time was spent in counseling.  Care during the described time interval was provided by me.  This care required moderate level of medical decision making.  06/19/2022    Subjective: (Chief complaint)  Patient had diarrhea with some overflow incontinence/leaking.  Retched x1 this morning.  Denies much abdominal pain.  Denies much nausea.  Disappointed.  Objective:  Vital signs:  Vitals:   06/18/22 1337 06/18/22 2147 06/19/22 0500 06/19/22 0646  BP: (!) 96/54 119/67  (!) 121/53  Pulse: 70 72  76  Resp: '18 18  16  '$ Temp: 97.7 F (36.5 C) (!) 97.4 F (36.3 C)  98.4 F (36.9 C)  TempSrc: Oral Oral  Oral  SpO2: 100% 99%  100%  Weight:   47.6 kg   Height:        Last BM Date : 06/18/22  Intake/Output   Yesterday:  06/30 0701 - 07/01 0700 In: 1291.8 [P.O.:1240; I.V.:51.8] Out: 125 [Emesis/NG output:125] This shift:  No intake/output data recorded.  Bowel function:  Flatus: YES  BM:  YES  Drain: (No drain)   Physical Exam:  General: Pt awake/alert in no  acute distress.  Not toxic or sickly.  Just somewhat disappointed.  . Eyes: PERRL, normal EOM.  Sclera clear.  No icterus Neuro: CN II-XII intact w/o focal sensory/motor deficits. Lymph: No head/neck/groin lymphadenopathy Psych:  No delerium/psychosis/paranoia.  Oriented x 4 HENT: Normocephalic, Mucus membranes moist.  No thrush Neck: Supple, No tracheal deviation.  No obvious thyromegaly Chest: No pain to chest wall compression.  Good respiratory excursion.  No audible wheezing CV:  Pulses intact.  Regular rhythm.  No major extremity edema MS: Normal AROM mjr joints.  No obvious deformity  Abdomen: Soft.  Mildy distended.  Nontender.  No evidence of peritonitis.  No incarcerated hernias.  Ext:   No deformity.  No mjr edema.  No cyanosis Skin: No petechiae / purpurea.  No major sores.  Warm and dry    Results:   Cultures: No results found for this or any previous visit (from the past 720 hour(s)).  Labs: Results for orders placed or performed during the hospital encounter of 06/17/22 (from the past 48 hour(s))  ABO/Rh     Status: None   Collection Time: 06/17/22 12:25 PM  Result Value Ref Range   ABO/RH(D)      O POS Performed at [facility], [place] [address] [place], [place] [address]   Basic metabolic panel     Status: Abnormal   Collection Time: 06/18/22  4:32 AM  Result Value Ref Range   Sodium 135 135 - 145 mmol/L   Potassium 3.4 (L) 3.5 - 5.1 mmol/L   Chloride 104 98 - 111 mmol/L   CO2 23 22 - 32 mmol/L   Glucose, Bld 96 70 - 99 mg/dL    Comment: Glucose reference range applies only to samples taken after fasting for at least 8 hours.   BUN 16 8 - 23 mg/dL   Creatinine, Ser 1.49 (H) 0.44 - 1.00 mg/dL   Calcium 7.8 (L) 8.9 - 10.3 mg/dL   GFR, Estimated 34 (L) >60 mL/min    Comment: (NOTE) Calculated using the CKD-EPI Creatinine Equation (2021)    Anion gap 8 5 - 15    Comment: Performed at [facility], [place] [address],  [place], [place] [address]  CBC     Status: Abnormal   Collection Time: 06/18/22  4:32 AM  Result Value Ref Range   WBC 12.0 (H) 4.0 - 10.5 K/uL   RBC 3.09 (L) 3.87 - 5.11 MIL/uL   Hemoglobin 8.3 (L) 12.0 - 15.0 g/dL   HCT 26.4 (L) 36.0 - 46.0 %   MCV 85.4 80.0 - 100.0 fL   MCH 26.9 26.0 - 34.0 pg   MCHC 31.4 30.0 - 36.0 g/dL   RDW 22.8 (H) 11.5 - 15.5 %   Platelets 250 150 - 400 K/uL   nRBC 0.0 0.0 - 0.2 %    Comment: Performed at [facility], [place] [address], [place], [place] [address]  Magnesium     Status: None   Collection Time: 06/18/22  4:32 AM  Result Value Ref Range   Magnesium 2.0 1.7 - 2.4 mg/dL    Comment: Performed at [facility], [place] [address] [place], [place] [address]  Hemoglobin     Status: Abnormal   Collection Time: 06/19/22 12:13 AM  Result Value Ref Range   Hemoglobin 8.4 (L) 12.0 - 15.0 g/dL    Comment: Performed at [facility], [place] [address], [place], [place] [address]  Potassium     Status: Abnormal   Collection Time: 06/19/22 12:13 AM  Result Value Ref Range   Potassium 3.4 (L) 3.5 - 5.1 mmol/L    Comment: Performed at [facility], [place] [address], [place], [place] [address]  Creatinine, serum     Status: Abnormal   Collection Time: 06/19/22 12:13 AM  Result Value Ref Range   Creatinine, Ser 1.50 (H) 0.44 - 1.00 mg/dL   GFR, Estimated 34 (L) >60 mL/min    Comment: (NOTE) Calculated using the CKD-EPI Creatinine Equation (2021) Performed at [facility], [place] [address], [place], [place] [address]     Imaging / Studies: No results found.  Medications / Allergies: per chart  Antibiotics: Anti-infectives (From admission, onward)    Start     Dose/Rate Route Frequency Ordered Stop   06/18/22 0200  cefoTEtan (CEFOTAN) 2 g in sodium chloride 0.9 % 100 mL IVPB        2 g 200 mL/hr over 30 Minutes Intravenous Every 12 hours 06/17/22 1754 06/18/22 0200    06/17/22 1400  neomycin (MYCIFRADIN) tablet 1,000 mg  Status:  Discontinued       See Hyperspace for full Linked Orders Report.   1,000 mg Oral 3 times per day 06/17/22 1153 06/17/22 1159   06/17/22 1400  metroNIDAZOLE (FLAGYL) tablet 1,000 mg  Status:  Discontinued       See Hyperspace for full Linked Orders Report.   1,000 mg Oral 3 times per day 06/17/22 1153 06/17/22 1159   06/17/22 1200  cefoTEtan (CEFOTAN) 2 g in sodium chloride 0.9 % 100 mL IVPB        2 g 200 mL/hr over 30 Minutes Intravenous On call to O.R. 06/17/22 1153 06/17/22 1441         Note: Portions of this report may have been transcribed using voice recognition software. Every effort was made to ensure accuracy; however, inadvertent computerized transcription errors may be present.   Any transcriptional errors that result from this process are unintentional.    Adin Hector, MD, FACS, MASCRS Esophageal, Gastrointestinal & Colorectal Surgery Robotic and Minimally Invasive Surgery  [facility], [place]  [place]. [address] [place],  [address] [phone] Fax [phone] Main  CONTACT INFORMATION:  Weekday (9AM-5PM): Call [facility] main office at [phone]  Weeknight (5PM-9AM) or Weekend/Holiday: Check [email or web address] (password " [email or web address]") for General Surgery [facility] coverage  (Please, do not use SecureChat as it is not reliable communication to operating surgeons for immediate patient care)      06/19/2022  7:49 AM

## 2022-06-20 LAB — CREATININE, SERUM
Creatinine, Ser: 1.89 mg/dL — ABNORMAL HIGH (ref 0.44–1.00)
GFR, Estimated: 26 mL/min — ABNORMAL LOW (ref >60)

## 2022-06-20 LAB — POTASSIUM: Potassium: 3.6 mmol/L (ref 3.5–5.1)

## 2022-06-20 MED ORDER — BISMUTH SUBSALICYLATE 262 MG/15ML PO SUSP: 30.0000 mL | Freq: Three times a day (TID) | ORAL | Status: DC | PRN

## 2022-06-20 MED ORDER — SODIUM CHLORIDE 0.9 % IV SOLN
125.0000 mg | Freq: Once | INTRAVENOUS | Status: AC
  Administered 2022-06-20: 125 mg via INTRAVENOUS
  Filled 2022-06-20: qty 10

## 2022-06-20 MED ORDER — LACTATED RINGERS IV BOLUS
1000.0000 mL | Freq: Once | INTRAVENOUS | Status: AC
  Administered 2022-06-20: 1000 mL via INTRAVENOUS

## 2022-06-20 MED ORDER — LACTATED RINGERS IV BOLUS: 1000.0000 mL | Freq: Three times a day (TID) | INTRAVENOUS | Status: AC | PRN

## 2022-06-20 NOTE — Progress Notes (Signed)
Ann [name] [id number] Jul 26, 1935  CARE TEAM:  PCP: Hoyt Koch, MD  Outpatient Care Team: Patient Care Team: Hoyt Koch, MD as PCP - General (Internal Medicine) [facility] 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 No obvious metastatic disease on visceral parietal peritoneum or liver.   It is an isoperistaltic ileocolonic anastomosis that rests in the periumbilical region.  Assessment  Mostly improved.  [facility] Stay = 3 days)  Plan:  Patient claims she vomited yesterday but feels better now.  Tolerated liquids.  Hungry.  We will try soft diet and see how it goes  Not surprising she had loose bowel movements with the ileocecal valve gone.  Backed off in fiber and gave Pepto x2.  That seems to have slowed down things.  We will see.  -ERAS pathway.  -Patient does not seem dehydrated but creatinine up a little bit.  We will give IV fluid bolus and follow with as needed backup.   Probably has some baseline chronic kidney disease.  Not oliguric.  Try and keep on the dry side from a colon standpoint but not so dry from a kidney standpoint.  Follow.  Low potassium.  Replace and follow.    Anemia.  Iron deficiency leaving from chronic blood loss in the setting of acute blood loss anemia after surgery.  Give IV iron since I suspect her absorption will be fair at age 87.  Follow-up on pathology.  -VTE prophylaxis- SCDs, etc  -mobilize as tolerated to help recovery.  Has PT and OT evaluate to see if she would benefit from home health.  She is usually pretty spry better hesitancy to mobilize raises concern for May.  See if they have any objective concerns.  Disposition:  Disposition:  The patient is from: Home  Anticipate discharge to:  Home  Anticipated Date of Discharge is:  July 4,2023    Barriers to discharge:  Pending Clinical improvement (more likely than not)  Patient currently is NOT MEDICALLY STABLE for discharge from the hospital from a surgery standpoint.      I reviewed nursing notes, last 24 h vitals and pain scores, last 48 h intake and output, last 24 h labs and trends, and last 24 h imaging results. I have reviewed this patient's available data, including medical history, events of note, test results, etc as part of my evaluation.  A significant portion of that time was spent in counseling.  Care during the described time interval was provided by me.  This care required moderate level of medical decision making.  06/20/2022  Subjective: (Chief complaint)  Patient with less diarrhea.  Denies abdominal pain.    Tolerated thicker liquids but had an episode of retching yesterday.  Feels better this morning.  Got up in chair but did not want to walk.  Objective:  Vital signs:  Vitals:   06/19/22 1353 06/19/22 2127 06/20/22 0500 06/20/22 0607  BP: (!) 131/58 (!) 141/61  (!) 143/77  Pulse: 77 71  76  Resp: '18 16  16  '$ Temp: 97.8 F (36.6  C) 97.6 F (36.4 C)  97.9 F (36.6 C)  TempSrc: Oral Oral  Oral  SpO2: 100% 100%  99%  Weight:   47.6 kg   Height:        Last BM Date : 06/19/22  Intake/Output   Yesterday:  07/01 0701 - 07/02 0700 In: 600 [P.O.:600] Out: 1 [Stool:1] This shift:  No intake/output data recorded.  Bowel function:  Flatus: YES  BM:  YES  Drain: (No drain)   Physical Exam:  General: Pt awake/alert in no acute distress.  Bright and alert.  Sitting up.  Calm.. Eyes: PERRL, normal EOM.  Sclera clear.  No icterus Neuro: CN II-XII intact w/o focal sensory/motor deficits. Lymph: No head/neck/groin lymphadenopathy Psych:  No delerium/psychosis/paranoia.  Oriented x 4 HENT: Normocephalic, Mucus membranes moist.  No thrush Neck: Supple, No tracheal deviation.  No obvious thyromegaly Chest: No pain to chest wall compression.  Good respiratory excursion.  No audible wheezing CV:  Pulses intact.  Regular rhythm.  No major extremity edema MS: Normal AROM mjr joints.  No obvious deformity  Abdomen: Soft.  Nondistended.  Nontender.  Dressing clean dry and intact.  No evidence of peritonitis.  No incarcerated hernias.  Ext:   No deformity.  No mjr edema.  No cyanosis Skin: No petechiae / purpurea.  No major sores.  Warm and dry    Results:   Cultures: No results found for this or any previous visit (from the past 720 hour(s)).  Labs: Results for orders placed or performed during the hospital encounter of 06/17/22 (from the past 48 hour(s))  Hemoglobin     Status: Abnormal   Collection Time: 06/19/22 12:13 AM  Result Value Ref Range   Hemoglobin 8.4 (L) 12.0 - 15.0 g/dL    Comment: Performed at [facility], [place] [address] [place], [place] [address]  Potassium     Status: Abnormal   Collection Time: 06/19/22 12:13 AM  Result Value Ref Range   Potassium 3.4 (L) 3.5 - 5.1 mmol/L    Comment: Performed at [facility], [place] [address], [place], [place]  [address]  Creatinine, serum     Status: Abnormal   Collection Time: 06/19/22 12:13 AM  Result Value Ref Range   Creatinine, Ser 1.50 (H) 0.44 - 1.00 mg/dL   GFR, Estimated 34 (L) >60 mL/min    Comment: (NOTE) Calculated using the CKD-EPI Creatinine Equation (2021) Performed at [facility], [place] [address] [place], [place] [address]   Creatinine, serum     Status: Abnormal   Collection Time: 06/20/22  5:15 AM  Result Value Ref Range   Creatinine, Ser 1.89 (H) 0.44 - 1.00 mg/dL   GFR, Estimated 26 (L) >60 mL/min    Comment: (NOTE) Calculated using the CKD-EPI Creatinine Equation (2021) Performed at [facility], [place] [address] [place], [place] [address]   Potassium     Status: None   Collection Time: 06/20/22  5:15 AM  Result  Value Ref Range   Potassium 3.6 3.5 - 5.1 mmol/L    Comment: Performed at [facility], [place] [address] [place], [place] [address]    Imaging / Studies: No results found.  Medications / Allergies: per chart  Antibiotics: Anti-infectives (From admission, onward)    Start     Dose/Rate Route Frequency Ordered Stop   06/18/22 0200  cefoTEtan (CEFOTAN) 2 g in sodium chloride 0.9 % 100 mL IVPB        2 g 200 mL/hr over 30 Minutes Intravenous Every 12 hours 06/17/22 1754 06/18/22 0200   06/17/22 1400  neomycin (MYCIFRADIN) tablet 1,000 mg  Status:  Discontinued       See Hyperspace for full Linked Orders Report.   1,000 mg Oral 3 times per day 06/17/22 1153 06/17/22 1159   06/17/22 1400  metroNIDAZOLE (FLAGYL) tablet 1,000 mg  Status:  Discontinued       See Hyperspace for full Linked Orders Report.   1,000 mg Oral 3 times per day 06/17/22 1153 06/17/22 1159   06/17/22 1200  cefoTEtan (CEFOTAN) 2 g in sodium chloride 0.9 % 100 mL IVPB        2 g 200 mL/hr over 30 Minutes Intravenous On call to O.R. 06/17/22 1153 06/17/22 1441         Note: Portions of this report may have been transcribed  using voice recognition software. Every effort was made to ensure accuracy; however, inadvertent computerized transcription errors may be present.   Any transcriptional errors that result from this process are unintentional.    Adin Hector, MD, FACS, MASCRS Esophageal, Gastrointestinal & Colorectal Surgery Robotic and Minimally Invasive Surgery  [facility], [place]  [place]. [address] [place], [place] [address] [phone] Fax [phone] Main  CONTACT INFORMATION:  Weekday (9AM-5PM): Call [facility] main office at [phone]  Weeknight (5PM-9AM) or Weekend/Holiday: Check [email or web address] (password " [email or web address]") for General Surgery [facility] coverage  (Please, do not use SecureChat as it is not reliable communication to operating surgeons for immediate patient care)      06/20/2022  8:28 AM

## 2022-06-20 NOTE — Evaluation (Addendum)
Physical Therapy Evaluation Patient Details Name: Ann [name] MRN: [id number] DOB: 06/04/1935 Today's Date: 06/20/2022  History of Present Illness  87 yo female admitted with colon cancer. S/P R colectomy 6/29. Hx of CKD  Clinical Impression  On eval, pt was Supv level assist for mobility. She walked ~200 feet with a RW. Pt tolerated distance well. No LOB with RW use. Encouraged pt to try to walk halls at least 3x/day. Will plan to follow pt during this hospital stay. Do not anticipate any f/u PT needs at this time. Recommend daily ambulation in hallway with nursing supervision.      Recommendations for follow up therapy are one component of a multi-disciplinary discharge planning process, led by the attending physician.  Recommendations may be updated based on patient status, additional functional criteria and insurance authorization.  Follow Up Recommendations No PT follow up      Assistance Recommended at Discharge PRN  Patient can return home with the following  Assistance with cooking/housework    Equipment Recommendations  (pt stated she can borrow DME if needed)  Recommendations for Other Services  OT consult    Functional Status Assessment Patient has had a recent decline in their functional status and demonstrates the ability to make significant improvements in function in a reasonable and predictable amount of time.     Precautions / Restrictions Precautions Precautions: Fall Restrictions Weight Bearing Restrictions: No      Mobility  Bed Mobility               General bed mobility comments: oob in recliner    Transfers Overall transfer level: Needs assistance Equipment used: Rolling walker (2 wheels) Transfers: Sit to/from Stand Sit to Stand: Supervision           General transfer comment: Cues for safety, hand placement    Ambulation/Gait Ambulation/Gait assistance: Min guard Gait Distance (Feet): 200 Feet Assistive device: Rolling walker (2  wheels) Gait Pattern/deviations: Step-through pattern, Decreased stride length       General Gait Details: Min guard for safety. No LOB with RW use. Pt tolerated distance well.  Stairs            Wheelchair Mobility    Modified Rankin (Stroke Patients Only)       Balance Overall balance assessment: Mild deficits observed, not formally tested                                           Pertinent Vitals/Pain Pain Assessment Pain Assessment: Faces Faces Pain Scale: Hurts little more Pain Location: abdomen Pain Descriptors / Indicators: Discomfort, Sore Pain Intervention(s): Monitored during session    Home Living Family/patient expects to be discharged to:: Private residence Living Arrangements: Alone               Home Equipment: None Additional Comments: pt stated she can borrow DME if needed    Prior Function Prior Level of Function : Independent/Modified Independent                     Hand Dominance        Extremity/Trunk Assessment   Upper Extremity Assessment Upper Extremity Assessment: Defer to OT evaluation    Lower Extremity Assessment Lower Extremity Assessment: Generalized weakness    Cervical / Trunk Assessment Cervical / Trunk Assessment: Normal  Communication   Communication: HOH  Cognition Arousal/Alertness: Awake/alert  Behavior During Therapy: WFL for tasks assessed/performed Overall Cognitive Status: Within Functional Limits for tasks assessed                                          General Comments      Exercises     Assessment/Plan    PT Assessment Patient needs continued PT services  PT Problem List Decreased strength;Decreased balance;Decreased activity tolerance;Pain;Decreased mobility       PT Treatment Interventions DME instruction;Gait training;Functional mobility training;Therapeutic activities;Balance training;Patient/family education;Therapeutic exercise    PT  Goals (Current goals can be found in the Care Plan section)  Acute Rehab PT Goals Patient Stated Goal: none stated PT Goal Formulation: With patient Time For Goal Achievement: 07/04/22 Potential to Achieve Goals: Good    Frequency Min 3X/week     Co-evaluation               AM-PAC PT "6 Clicks" Mobility  Outcome Measure Help needed turning from your back to your side while in a flat bed without using bedrails?: A Little Help needed moving from lying on your back to sitting on the side of a flat bed without using bedrails?: A Little Help needed moving to and from a bed to a chair (including a wheelchair)?: A Little Help needed standing up from a chair using your arms (e.g., wheelchair or bedside chair)?: A Little Help needed to walk in hospital room?: A Little Help needed climbing 3-5 steps with a railing? : A Little 6 Click Score: 18    End of Session   Activity Tolerance: Patient tolerated treatment well Patient left: in chair;with call bell/phone within reach;with family/visitor present   PT Visit Diagnosis: Unsteadiness on feet (R26.81);Pain Pain - part of body:  (abdomen)    Time: 1540-1600 PT Time Calculation (min) (ACUTE ONLY): 20 min   Charges:   PT Evaluation $PT Eval Low Complexity: [place], PT Acute Rehabilitation  Office: [phone] Pager: [phone]

## 2022-06-21 ENCOUNTER — Inpatient Hospital Stay (HOSPITAL_COMMUNITY): Payer: Medicare Other

## 2022-06-21 ENCOUNTER — Other Ambulatory Visit (HOSPITAL_COMMUNITY): Payer: Self-pay

## 2022-06-21 LAB — CREATININE, SERUM
Creatinine, Ser: 2.45 mg/dL — ABNORMAL HIGH (ref 0.44–1.00)
GFR, Estimated: 19 mL/min — ABNORMAL LOW (ref >60)

## 2022-06-21 LAB — CBC
HCT: 33.7 % — ABNORMAL LOW (ref 36.0–46.0)
Hemoglobin: 10.7 g/dL — ABNORMAL LOW (ref 12.0–15.0)
MCH: 26.8 pg (ref 26.0–34.0)
MCHC: 31.8 g/dL (ref 30.0–36.0)
MCV: 84.3 fL (ref 80.0–100.0)
Platelets: 557 10*3/uL — ABNORMAL HIGH (ref 150–400)
RBC: 4 MIL/uL (ref 3.87–5.11)
RDW: 23.5 % — ABNORMAL HIGH (ref 11.5–15.5)
WBC: 18.6 10*3/uL — ABNORMAL HIGH (ref 4.0–10.5)
nRBC: 0 % (ref 0.0–0.2)

## 2022-06-21 LAB — POTASSIUM: Potassium: 3.7 mmol/L (ref 3.5–5.1)

## 2022-06-21 MED ORDER — FENTANYL CITRATE PF 50 MCG/ML IJ SOSY: 12.5000 ug | PREFILLED_SYRINGE | INTRAMUSCULAR | Status: DC | PRN

## 2022-06-21 MED ORDER — METOCLOPRAMIDE HCL 5 MG PO TABS
5.0000 mg | ORAL_TABLET | Freq: Three times a day (TID) | ORAL | Status: AC
  Administered 2022-06-21 – 2022-06-22 (×8): 5 mg via ORAL
  Filled 2022-06-21 (×8): qty 1

## 2022-06-21 MED ORDER — HEPARIN SODIUM (PORCINE) 5000 UNIT/ML IJ SOLN
5000.0000 [IU] | Freq: Three times a day (TID) | INTRAMUSCULAR | Status: DC
  Administered 2022-06-21 – 2022-06-24 (×9): 5000 [IU] via SUBCUTANEOUS
  Filled 2022-06-21 (×9): qty 1

## 2022-06-21 MED ORDER — POLYETHYLENE GLYCOL 3350 17 G PO PACK
17.0000 g | PACK | Freq: Every day | ORAL | Status: DC
  Administered 2022-06-22: 17 g via ORAL
  Filled 2022-06-21: qty 1

## 2022-06-21 MED ORDER — LACTATED RINGERS IV BOLUS
1000.0000 mL | Freq: Once | INTRAVENOUS | Status: AC
  Administered 2022-06-21: 1000 mL via INTRAVENOUS

## 2022-06-21 MED ORDER — ONDANSETRON HCL 4 MG PO TABS: 4.0000 mg | ORAL_TABLET | Freq: Three times a day (TID) | ORAL | Status: DC

## 2022-06-21 MED ORDER — LACTATED RINGERS IV SOLN: INTRAVENOUS | Status: DC

## 2022-06-21 MED ORDER — HYDROCODONE-ACETAMINOPHEN 5-325 MG PO TABS: 1.0000 | ORAL_TABLET | ORAL | Status: DC | PRN

## 2022-06-21 NOTE — Progress Notes (Addendum)
Ann [name] [id number] 1935/01/11  CARE TEAM:  PCP: Hoyt Koch, MD  Outpatient Care Team: Patient Care Team: Hoyt Koch, MD as PCP - General (Internal Medicine) [facility] Doran Stabler, MD as Consulting Physician (Gastroenterology) Michael Boston, MD as Consulting Physician (General Surgery) Zadie Rhine Clent Demark, MD as Consulting Physician (Ophthalmology)  Inpatient Treatment Team: Treatment Team: Attending Provider: Michael Boston, MD; Technician: Constance Goltz, NT; Registered Nurse: Verdie Drown, RN; Occupational Therapist: Marcellina Millin, OT; Charge Nurse: Steward Ros, RN   Problem List:   Principal Problem:   Cancer hepatic flexure s/p robotic proximal right colectomy 06/17/2022 Active Problems:   Osteopenia   Iron deficiency anemia due to chronic blood loss   CKD (chronic kidney disease) stage 2, GFR 60-89 ml/min   Hypokalemia   4 Days Post-Op  06/17/2022  POST-OPERATIVE DIAGNOSIS:  Cancer of Hepatic Flexure of Colon   PROCEDURE:   ROBOTIC PROXIMAL RIGHT COLECTOMY ASSESSMENT OF TISSUE PERFUSION VIA FIREFLY INJECTION TRANSVERSUS ABDOMINIS PLANE (TAP) BLOCK - BILATERAL   SURGEON:  Adin Hector, MD  OR FINDINGS:    Patient had bulky tumor at hepatic flexure with a sending colon to proximal transverse colon adhesions. No obvious metastatic disease on visceral parietal peritoneum or liver.   It is an isoperistaltic ileocolonic anastomosis that rests in the periumbilical region.  Assessment  Ileus with dehydration  [facility] Stay = 4 days)  Plan:  Persistently confusing picture with loose bowel movements but nightly emesis.  Placed on standing nausea medications.  Try low-dose metoclopramide for 2 days to see if that helps with gastric emptying.  Continue ondansetron and prochlorperazine for backup.    Return to full liquid/dysphagia 1 diet.  Calorie counts.  Follow mental status.  Switch narcotics around.   Some time that could be a source of emesis but patient claims she is really not having any pain nor needing narcotics.  Simplify medications.  Check plain films.  Suspect ileus.  She has no peritonitis or rebound abdominal pain or guarding.  No shock.  That argues against abscess or leak.  Follow closely.  Elevated creatinine concerning.  Possible AKI.  She claims she is urinating OK.  I again stressed the importance of strict I's and O's to nursing and patient.  Place on standing IV fluids and rehydrate again.  If not improved, medicine possible nephrology consult.    Low potassium.  Not severe at this time.  Hold off on aggressive supplementation with increased creatinine but follow closely.  Switch bowel regimen.  Try just 1 daily dose of MiraLAX to help thicken and solidify bowels.  No more than that for now.  Pepto for backup.  Do not want to do Imodium just yet.  Patient still prefers to wear diapers since she had overflow incontinence when she had many loose bowel movements early in admission.  Things have slowed down but she is being cautious  ERAS pathway.  Anemia.  Iron deficiency leaving from chronic blood loss in the setting of acute blood loss anemia after surgery.  Given IV iron 7/2 since I suspect her absorption will be fair at age 42.  Follow-up on pathology.  -VTE prophylaxis- SCDs.  Switch from enoxaparin to heparin given elevated creatinine.  -mobilize as tolerated to help recovery.  Seen by physical therapy.  Patient claims she has assist device that she can borrow.  Most likely will do okay.  We will start discharge planning with home health for  at least home safety visit and possibly occupational therapy.  However wish to get input from OT first.  Awaiting evaluation.  Disposition:  Disposition:  The patient is from: Home  Anticipate discharge to:  Home with Home Health  Anticipated Date of Discharge is:  July 6,2023    Barriers to discharge:  Pending Clinical  improvement (more likely than not)  Patient currently is NOT MEDICALLY STABLE for discharge from the hospital from a surgery standpoint.      I reviewed nursing notes, last 24 h vitals and pain scores, last 48 h intake and output, last 24 h labs and trends, and last 24 h imaging results. I have reviewed this patient's available data, including medical history, events of note, test results, etc as part of my evaluation.  A significant portion of that time was spent in counseling.  Care during the described time interval was provided by me.  This care required moderate level of medical decision making.  06/21/2022    Subjective: (Chief complaint)  Patient with emesis at night again.  Denies any nausea.  No problems during the daytime.  Appetite was okay.  Fewer bowel movements.  Got up in chair.  Actually walked in the hallways x2.  Seen by physical therapy.  Objective:  Vital signs:  Vitals:   06/20/22 0607 06/20/22 1312 06/20/22 2130 06/21/22 0558  BP: (!) 143/77 117/87 (!) 148/69 (!) 147/72  Pulse: 76 87 75 76  Resp: '16 18 18 18  '$ Temp: 97.9 F (36.6 C) (!) 97.3 F (36.3 C) 97.8 F (36.6 C) (!) 97.5 F (36.4 C)  TempSrc: Oral Oral Oral Oral  SpO2: 99% 100% 100% 100%  Weight:      Height:        Last BM Date : 06/19/22  Intake/Output   Yesterday:  07/02 0701 - 07/03 0700 In: 1022.4 [P.O.:241; IV Piggyback:781.4] Out: 2 [Urine:1; Stool:1] This shift:  No intake/output data recorded.  Bowel function:  Flatus: YES  BM:  YES  Drain: (No drain)   Physical Exam:  General: Pt sleeping but awakens to be  in no acute distress.  Groggy for a few seconds but then alert. Eyes: PERRL, normal EOM.  Sclera clear.  No icterus Neuro: CN II-XII intact w/o focal sensory/motor deficits. Lymph: No head/neck/groin lymphadenopathy Psych:  No delerium/psychosis/paranoia.  Oriented x 4 HENT: Normocephalic, Mucus membranes moist.  No thrush Neck: Supple, No tracheal  deviation.  No obvious thyromegaly Chest: No pain to chest wall compression.  Good respiratory excursion.  No audible wheezing CV:  Pulses intact.  Regular rhythm.  No major extremity edema MS: Normal AROM mjr joints.  No obvious deformity  Abdomen: Soft.  Nondistended.  Nontender.  Incisions clean dry intact.  No ecchymosis or cellulitis.  No guarding.  No evidence of peritonitis.  No incarcerated hernias.  Ext:   No deformity.  No mjr edema.  No cyanosis Skin: No petechiae / purpurea.  No major sores.  Warm and dry    Results:   Cultures: No results found for this or any previous visit (from the past 720 hour(s)).  Labs: Results for orders placed or performed during the hospital encounter of 06/17/22 (from the past 48 hour(s))  Creatinine, serum     Status: Abnormal   Collection Time: 06/20/22  5:15 AM  Result Value Ref Range   Creatinine, Ser 1.89 (H) 0.44 - 1.00 mg/dL   GFR, Estimated 26 (L) >60 mL/min    Comment: (NOTE) Calculated using  the CKD-EPI Creatinine Equation (2021) Performed at [facility], [place] [address], [place], [place] [address]   Potassium     Status: None   Collection Time: 06/20/22  5:15 AM  Result Value Ref Range   Potassium 3.6 3.5 - 5.1 mmol/L    Comment: Performed at [facility], [place] [address], [place], [place] [address]  Potassium     Status: None   Collection Time: 06/21/22  4:17 AM  Result Value Ref Range   Potassium 3.7 3.5 - 5.1 mmol/L    Comment: Performed at [facility], [place] [address] [place], [place] [address]  CBC     Status: Abnormal   Collection Time: 06/21/22  4:17 AM  Result Value Ref Range   WBC 18.6 (H) 4.0 - 10.5 K/uL   RBC 4.00 3.87 - 5.11 MIL/uL   Hemoglobin 10.7 (L) 12.0 - 15.0 g/dL   HCT 33.7 (L) 36.0 - 46.0 %   MCV 84.3 80.0 - 100.0 fL   MCH 26.8 26.0 - 34.0 pg   MCHC 31.8 30.0 - 36.0 g/dL   RDW 23.5 (H) 11.5 - 15.5 %   Platelets 557 (H) 150 - 400 K/uL    nRBC 0.0 0.0 - 0.2 %    Comment: Performed at [facility], [place] [address] [place], [place] [address]  Creatinine, serum     Status: Abnormal   Collection Time: 06/21/22  4:17 AM  Result Value Ref Range   Creatinine, Ser 2.45 (H) 0.44 - 1.00 mg/dL   GFR, Estimated 19 (L) >60 mL/min    Comment: (NOTE) Calculated using the CKD-EPI Creatinine Equation (2021) Performed at [facility], [place] [address] [place], [place] [address]     Imaging / Studies: No results found.  Medications / Allergies: per chart  Antibiotics: Anti-infectives (From admission, onward)    Start     Dose/Rate Route Frequency Ordered Stop   06/18/22 0200  cefoTEtan (CEFOTAN) 2 g in sodium chloride 0.9 % 100 mL IVPB        2 g 200 mL/hr over 30 Minutes Intravenous Every 12 hours 06/17/22 1754 06/18/22 0200   06/17/22 1400  neomycin (MYCIFRADIN) tablet 1,000 mg  Status:  Discontinued       See Hyperspace for full Linked Orders Report.   1,000 mg Oral 3 times per day 06/17/22 1153 06/17/22 1159   06/17/22 1400  metroNIDAZOLE (FLAGYL) tablet 1,000 mg  Status:  Discontinued       See Hyperspace for full Linked Orders Report.   1,000 mg Oral 3 times per day 06/17/22 1153 06/17/22 1159   06/17/22 1200  cefoTEtan (CEFOTAN) 2 g in sodium chloride 0.9 % 100 mL IVPB        2 g 200 mL/hr over 30 Minutes Intravenous On call to O.R. 06/17/22 1153 06/17/22 1441         Note: Portions of this report may have been transcribed using voice recognition software. Every effort was made to ensure accuracy; however, inadvertent computerized transcription errors may be present.   Any transcriptional errors that result from this process are unintentional.    Adin Hector, MD, FACS, MASCRS Esophageal, Gastrointestinal & Colorectal Surgery Robotic and Minimally Invasive Surgery  [facility], [place]  [place]. [address] [place], [place] [address] [phone] Fax [phone] Main  CONTACT INFORMATION:  Weekday (9AM-5PM): Call [facility] main office at  [phone]  Weeknight (5PM-9AM) or Weekend/Holiday: Check [email or web address] (password " [email or web address]") for General Surgery [facility] coverage  (Please, do not use SecureChat as it is not reliable communication to operating surgeons for immediate patient care)      06/21/2022  7:38 AM

## 2022-06-21 NOTE — Evaluation (Signed)
Occupational Therapy Evaluation Patient Details Name: Ann [name] MRN: [id number] DOB: 06-Dec-1935 Today's Date: 06/21/2022   History of Present Illness patient is a 87 year old female admitted with colon cancer. S/P R colectomy 6/29. Hx of CKD   Clinical Impression   Patient was noted to rely on usign RW when baseline is no AD on this date. Patient was noted to have decreased safety awareness and decreased functional activity tolerance impacting participation in ADLs. Patient anticipated to progress to no OT follow up by time of d/c . Patient would continue to benefit from skilled OT services at this time while admitted to address noted deficits in order to improve overall safety and independence in ADLs.        Recommendations for follow up therapy are one component of a multi-disciplinary discharge planning process, led by the attending physician.  Recommendations may be updated based on patient status, additional functional criteria and insurance authorization.   Follow Up Recommendations  No OT follow up    Assistance Recommended at Discharge Intermittent Supervision/Assistance  Patient can return home with the following A little help with bathing/dressing/bathroom;Assistance with cooking/housework;Direct supervision/assist for financial management;Assist for transportation;Help with stairs or ramp for entrance;Direct supervision/assist for medications management    Functional Status Assessment  Patient has had a recent decline in their functional status and demonstrates the ability to make significant improvements in function in a reasonable and predictable amount of time.  Equipment Recommendations  None recommended by OT    Recommendations for Other Services       Precautions / Restrictions Precautions Precautions: Fall Restrictions Weight Bearing Restrictions: No      Mobility Bed Mobility Overal bed mobility: Modified Independent                  Transfers                           Balance Overall balance assessment: Mild deficits observed, not formally tested                                         ADL either performed or assessed with clinical judgement   ADL Overall ADL's : Needs assistance/impaired Eating/Feeding: Modified independent;Sitting   Grooming: Wash/dry face;Set up;Sitting   Upper Body Bathing: Set up;Sitting   Lower Body Bathing: Set up;Sit to/from stand;Sitting/lateral leans   Upper Body Dressing : Set up;Sitting   Lower Body Dressing: Set up;Sit to/from stand Lower Body Dressing Details (indicate cue type and reason): patient was able to don/doff socks and complete clothing management. Toilet Transfer: Supervision/safety;Rolling walker (2 wheels) Toilet Transfer Details (indicate cue type and reason): patient was noted to use RW but leave behind with turns with education on using RW provided. patient reported " i dont use the RW" but was actively using to transfer from edge of bed to commode in bathroom. patient noted to have increased difficulty with education on this date. Toileting- Clothing Manipulation and Hygiene: Set up;Sit to/from stand Toileting - Clothing Manipulation Details (indicate cue type and reason): standing at commode with no UE support. patient was upset with the hosptial polict on no adult absorbent undergarments at this time. patient was provided with mesh underwear and incontinence liner at this time. NT in room to educate patient on the same     Functional mobility during ADLs: Supervision/safety;Rolling walker (2  wheels)       Vision Patient Visual Report: No change from baseline       Perception     Praxis      Pertinent Vitals/Pain       Hand Dominance Right   Extremity/Trunk Assessment Upper Extremity Assessment Upper Extremity Assessment: Overall WFL for tasks assessed   Lower Extremity Assessment Lower Extremity Assessment: Defer to PT  evaluation   Cervical / Trunk Assessment Cervical / Trunk Assessment: Normal   Communication Communication Communication: [facility]   Cognition                                             General Comments       Exercises     Shoulder Instructions      Home Living Family/patient expects to be discharged to:: Private residence Living Arrangements: Alone Available Help at Discharge: Family;Available PRN/intermittently Type of Home: House Home Access: Stairs to enter [employer] of Steps: 3   Home Layout: One level     Bathroom Shower/Tub: Tub/shower unit         Home Equipment: None   Additional Comments: pt stated she can borrow DME if needed      Prior Functioning/Environment Prior Level of Function : Independent/Modified Independent                        OT Problem List: Impaired balance (sitting and/or standing);Decreased safety awareness;Decreased activity tolerance      OT Treatment/Interventions: Self-care/ADL training;Neuromuscular education;Energy conservation;DME and/or AE instruction;Therapeutic activities;Balance training;Patient/family education    OT Goals(Current goals can be found in the care plan section) Acute Rehab OT Goals Patient Stated Goal: to get better OT Goal Formulation: With patient Time For Goal Achievement: 07/05/22 Potential to Achieve Goals: Fair  OT Frequency: Min 1X/week    Co-evaluation              AM-PAC OT "6 Clicks" Daily Activity     Outcome Measure Help from another person eating meals?: None Help from another person taking care of personal grooming?: None Help from another person toileting, which includes using toliet, bedpan, or urinal?: A Little Help from another person bathing (including washing, rinsing, drying)?: A Little Help from another person to put on and taking off regular upper body clothing?: A Little Help from another person to put on and taking off regular  lower body clothing?: A Little 6 Click Score: 20   End of Session Equipment Utilized During Treatment: Rolling walker (2 wheels)  Activity Tolerance: Patient tolerated treatment well Patient left: in bed;with call bell/phone within reach;with bed alarm set  OT Visit Diagnosis: Unsteadiness on feet (R26.81)                Time: 6812-7517 OT Time Calculation (min): 14 min Charges:  OT General Charges $OT Visit: 1 Visit OT Evaluation $OT Eval Low Complexity: 1 Low  Jackelyn Poling OTR/L, MS Acute Rehabilitation Department Office# [phone] Pager# [phone]   Marcellina Millin 06/21/2022, 8:33 AM

## 2022-06-22 ENCOUNTER — Inpatient Hospital Stay (HOSPITAL_COMMUNITY): Payer: Medicare Other

## 2022-06-22 LAB — CBC
HCT: 30.4 % — ABNORMAL LOW (ref 36.0–46.0)
Hemoglobin: 9.7 g/dL — ABNORMAL LOW (ref 12.0–15.0)
MCH: 26.6 pg (ref 26.0–34.0)
MCHC: 31.9 g/dL (ref 30.0–36.0)
MCV: 83.5 fL (ref 80.0–100.0)
Platelets: 485 10*3/uL — ABNORMAL HIGH (ref 150–400)
RBC: 3.64 MIL/uL — ABNORMAL LOW (ref 3.87–5.11)
RDW: 22.9 % — ABNORMAL HIGH (ref 11.5–15.5)
WBC: 11.5 10*3/uL — ABNORMAL HIGH (ref 4.0–10.5)
nRBC: 0 % (ref 0.0–0.2)

## 2022-06-22 LAB — CREATININE, SERUM
Creatinine, Ser: 1.75 mg/dL — ABNORMAL HIGH (ref 0.44–1.00)
GFR, Estimated: 28 mL/min — ABNORMAL LOW (ref >60)

## 2022-06-22 LAB — POTASSIUM: Potassium: 3.7 mmol/L (ref 3.5–5.1)

## 2022-06-22 MED ORDER — UNJURY CHICKEN SOUP POWDER
4.0000 [oz_av] | Freq: Two times a day (BID) | ORAL | Status: DC
  Administered 2022-06-22: 4 [oz_av] via ORAL
  Filled 2022-06-22: qty 27

## 2022-06-22 MED ORDER — PROSOURCE PLUS PO LIQD
30.0000 mL | Freq: Three times a day (TID) | ORAL | Status: DC
Start: 2022-06-22 — End: 2022-06-24
  Administered 2022-06-22 – 2022-06-23 (×3): 30 mL via ORAL
  Filled 2022-06-22 (×4): qty 30

## 2022-06-22 MED ORDER — ACETAMINOPHEN 500 MG PO TABS
500.0000 mg | ORAL_TABLET | Freq: Four times a day (QID) | ORAL | Status: DC
  Filled 2022-06-22 (×5): qty 1

## 2022-06-22 MED ORDER — RESOURCE INSTANT PROTEIN PO PWD PACKET
1.0000 | Freq: Three times a day (TID) | ORAL | Status: DC
  Administered 2022-06-22 – 2022-06-23 (×2): 6 g via ORAL
  Filled 2022-06-22 (×7): qty 6

## 2022-06-22 MED ORDER — BOOST / RESOURCE BREEZE PO LIQD CUSTOM
1.0000 | Freq: Three times a day (TID) | ORAL | Status: DC
  Administered 2022-06-22 – 2022-06-23 (×2): 1 via ORAL

## 2022-06-22 MED ORDER — IOHEXOL 9 MG/ML PO SOLN
500.0000 mL | ORAL | Status: AC
  Administered 2022-06-22: 500 mL via ORAL

## 2022-06-22 NOTE — Progress Notes (Signed)
Nutrition Follow-up   INTERVENTION:   -48 hour Calorie Count -starting after CT  -Boost Breeze po TID, each supplement provides 250 kcal and 9 grams of protein  -Prosource Plus PO TID, each provides 100 kcals and 15g protein  -Beneprotein powder TID, each provides 25 kcals and 6g protein  -Unjury chicken Soup 4 oz BID, Each serving provides 50 kcals and 10g protein   NUTRITION DIAGNOSIS:   Increased nutrient needs related to cancer and cancer related treatments as evidenced by estimated needs.  Ongoing.  GOAL:   Patient will meet greater than or equal to 90% of their needs  Not meeting.  MONITOR:   PO intake, Supplement acceptance, Labs, Weight trends, I & O's  REASON FOR ASSESSMENT:   Consult Calorie Count  ASSESSMENT:   87 y.o. patient admitted with cancer of hepatic flexure of colon and iron deficiency anemia.  Patient being taken to CT at time of visit. Spoke with pt's daughter at bedside who has been trying to encourage pt to eat but pt is stating she is not hungry or doesn't like the food options on her diet. Daughter suspects pt may be scared to eat and scared to vomit. Pt on Reglan and on bowel regimen to help with constipation. Surgery has order for 48 hour Calorie Count. Will start after CT given pt was unable to eat much this morning d/t having to drink contrast.  Discussed calorie count with RN.  RD to order multiple supplements as pt is not drinking the Ensure per daughter. Daughter agreed to trying all supplements ordered above.   PO intakes per daughter: Wonda Cheng 7/3: bites of [place] and carrots Breakfast 7/4: few spoonfuls of yogurt  Admission weight: 110 lbs. Current weight: 104 lbs (same x 3 days)  Medications: OSCAL, Reglan, Miralax, Lactated ringers  Labs reviewed.  Diet Order:   Diet Order             DIET - DYS 1 Room service appropriate? Yes; Fluid consistency: Thin  Diet effective now           Diet - low sodium heart healthy                    EDUCATION NEEDS:   No education needs have been identified at this time  Skin:  Skin Assessment: Skin Integrity Issues: Skin Integrity Issues:: Incisions Incisions: 6/29 abdomen  Last BM:  7/4  Height:   Ht Readings from Last 1 Encounters:  06/17/22 '5\' 3"'$  (1.6 m)    Weight:   Wt Readings from Last 1 Encounters:  06/22/22 47.2 kg   BMI:  Body mass index is 18.43 kg/m.  Estimated Nutritional Needs:   Kcal:  1500-1700  Protein:  75-85g  Fluid:  1.7L/day   Clayton Bibles, MS, RD, LDN Inpatient Clinical Dietitian Contact information available via Amion

## 2022-06-22 NOTE — Progress Notes (Signed)
Physical Therapy Treatment Patient Details Name: Ann [name] MRN: [id number] DOB: [birth year] Today's Date: 06/22/2022   History of Present Illness patient is a 87 year old female admitted with colon cancer. S/P R colectomy 6/29. Hx of CKD    PT Comments    Pt supine in bed and agreeable to be seen. Educated pt on logroll technique and pt was supervision for all portions of bed mobility except sit to sidelying; required min assist to bring BLE into bed. Pt demonstrated modified independence for transfers and supervision for gait with RW ~375f; reinforced importance of walking for bowel motility and encouraged pt to walk outside of PT session, pt verbalized understanding. Pt is progressing well; we will continue to follow her acutely.     Recommendations for follow up therapy are one component of a multi-disciplinary discharge planning process, led by the attending physician.  Recommendations may be updated based on patient status, additional functional criteria and insurance authorization.  Follow Up Recommendations  No PT follow up     Assistance Recommended at Discharge PRN  Patient can return home with the following Assistance with cooking/housework   Equipment Recommendations   (pt stated she can borrow DME if needed)    Recommendations for Other Services       Precautions / Restrictions Precautions Precautions: Fall Restrictions Weight Bearing Restrictions: No     Mobility  Bed Mobility Overal bed mobility: Needs Assistance Bed Mobility: Rolling, Sidelying to Sit, Sit to Sidelying Rolling: Supervision Sidelying to sit: Supervision     Sit to sidelying: Min assist General bed mobility comments: Pt educated on logroll technique, verbalized understanding. Required supervision for safety during rolling and sidelying to sit, min assist to bring BLE into bed during sit to sidelying.    Transfers Overall transfer level: Needs assistance Equipment used: Rolling walker (2  wheels) Transfers: Sit to/from Stand Sit to Stand: Modified independent (Device/Increase time)           General transfer comment: Increased time.    Ambulation/Gait Ambulation/Gait assistance: Supervision Gait Distance (Feet): 300 Feet Assistive device: Rolling walker (2 wheels) Gait Pattern/deviations: Step-through pattern, Decreased stride length Gait velocity: decreased     General Gait Details: Supevision for safety. No LOB with RW use. Pt tolerated distance well.   Stairs             Wheelchair Mobility    Modified Rankin (Stroke Patients Only)       Balance Overall balance assessment: Mild deficits observed, not formally tested                                          Cognition Arousal/Alertness: Awake/alert Behavior During Therapy: WFL for tasks assessed/performed Overall Cognitive Status: Within Functional Limits for tasks assessed                                          Exercises      General Comments General comments (skin integrity, edema, etc.): Pt's daughter present for session      Pertinent Vitals/Pain Pain Assessment Pain Assessment: Faces Faces Pain Scale: Hurts a little bit Pain Location: abdomen Pain Descriptors / Indicators: Discomfort, Sore Pain Intervention(s): Monitored during session, Repositioned    Home Living  Prior Function            PT Goals (current goals can now be found in the care plan section) Acute Rehab PT Goals Patient Stated Goal: none stated PT Goal Formulation: With patient Time For Goal Achievement: 07/04/22 Potential to Achieve Goals: Good Progress towards PT goals: Progressing toward goals    Frequency    Min 3X/week      PT Plan Current plan remains appropriate    Co-evaluation              AM-PAC PT "6 Clicks" Mobility   Outcome Measure  Help needed turning from your back to your side while in a flat bed  without using bedrails?: None Help needed moving from lying on your back to sitting on the side of a flat bed without using bedrails?: A Little Help needed moving to and from a bed to a chair (including a wheelchair)?: None Help needed standing up from a chair using your arms (e.g., wheelchair or bedside chair)?: None Help needed to walk in hospital room?: A Little Help needed climbing 3-5 steps with a railing? : A Little 6 Click Score: 21    End of Session   Activity Tolerance: Patient tolerated treatment well Patient left: with call bell/phone within reach;in bed;with bed alarm set;with family/visitor present Nurse Communication: Mobility status PT Visit Diagnosis: Unsteadiness on feet (R26.81);Pain Pain - part of body:  (abdomen)     Time: 2500-3704 PT Time Calculation (min) (ACUTE ONLY): 11 min  Charges:  $Gait Training: 8-22 mins                     Coolidge Breeze, PT, DPT [place] Rehabilitation Department Office: [phone] Pager: [phone]  Coolidge Breeze 06/22/2022, 5:43 PM

## 2022-06-22 NOTE — Progress Notes (Signed)
Ann [name] [id number] Jun 19, 1935  CARE TEAM:  PCP: Hoyt Koch, MD  Outpatient Care Team: Patient Care Team: Hoyt Koch, MD as PCP - General (Internal Medicine) [facility] 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 No obvious metastatic disease on visceral parietal peritoneum or liver.   It is an isoperistaltic ileocolonic anastomosis that rests in the periumbilical region.  Assessment  Ileus with dehydration  [facility] Stay = 5 days)  Plan:  Persistently confusing picture with loose bowel movements but nightly emesis.  White count improved with no fever but still with confusing picture.  Will order CAT scan.  We will do oral contrast only given her elevated creatinine.  Rule out hematoma/fluid collection contributing to ileus.  Placed on standing nausea medications.  Try low-dose metoclopramide for 2  days to see if that helps with gastric emptying.  Continue ondansetron and prochlorperazine for backup.    Gentle bowel regimen.  Doing MiraLAX once a day since she seemed bloated with the Frederika.  Plan liquid diet only.  Daughter here and wanting to try and get the patient to eat more.  Cannot necessarily force things.  If does not advance may need to start TPN and postop day 7.  We will see.  Calorie counts.  Follow mental status.    Switched narcotics around.  Some time that could be a source of emesis but patient claims she is really not having any pain nor needing narcotics.  Simplify medications.  Elevated creatinine concerning for patient nonoliguric and creatinine improving.  Follow.  Continue strict I&O.  Low potassium.  Not severe at this time.  Hold off on aggressive supplementation with increased creatinine but follow closely.  Switch bowel regimen.  Try just 1 daily dose of MiraLAX to help thicken and solidify bowels.  No more than that for now.  Pepto for backup.  Do not want to do Imodium just yet.  Patient still prefers to wear diapers since she had overflow incontinence when she had many loose bowel movements early in admission.  Things have slowed down but she is being cautious  ERAS pathway.  Anemia.  Iron deficiency from chronic blood loss in the setting of acute blood loss anemia after surgery.  Given IV iron 7/2 since I suspect her absorption will be fair at age 87.  Follow-up on pathology.  -VTE prophylaxis- SCDs.  Switch from enoxaparin to heparin given elevated creatinine.  -mobilize as tolerated to help recovery.  Seen by physical therapy -they  felt she was walking okay on the hallways with a walker.  Try to encourage her to walk more.  Patient appears to be declining.  Daughter concerned.  Discussed with nursing Manuela Schwartz to try and motivate the patient more to get up more.  Discharge planning with home health for at least home safety visit and occupational therapy.     I updated the patient's status to the  patient, daughter, and nurse Manuela Schwartz.   Recommendations were made.  Questions were answered.  They expressed understanding & appreciation.   Disposition:  Disposition:  The patient is from: Home  Anticipate discharge to:  Home with Home Health  Anticipated Date of Discharge is:  July 7,2023    Barriers to discharge:  Pending Clinical improvement (more likely than not)  Patient currently is NOT MEDICALLY STABLE for discharge from the hospital from a surgery standpoint.      I reviewed nursing notes, last 24 h vitals and pain scores, last 48 h intake and output, last 24 h labs and trends, and last 24 h imaging results. I have reviewed this patient's available data, including medical history, events of note, test results, etc as part of my evaluation.  A significant portion of that time was spent in counseling.  Care during the described time interval was provided by me.  This care required moderate level of medical decision making.  06/22/2022    Subjective: (Chief complaint)  Patient with emesis at night again.  Again denies any nausea now.  Does not feel like she has much of an appetite.  Patient denies any abdominal pain.  No fever chills or sweats.  Diarrhea has calmed down but did have a bowel movement.  Denies any nausea.  No problems during the daytime.  Daughter in room.  Concerned.  Patient not wanting to walk much but did walk with physical therapy yesterday.  Objective:  Vital signs:  Vitals:   06/21/22 1212 06/21/22 2150 06/22/22 0438 06/22/22 0500  BP: 121/66 (!) 150/65 (!) 148/67   Pulse: 94 78 74   Resp:  16 16   Temp: 98.5 F (36.9 C) (!) 97.5 F (36.4 C) 97.6 F (36.4 C)   TempSrc: Oral Oral Oral   SpO2: 97% 100% 100%   Weight:    47.2 kg  Height:        Last BM Date : 06/21/22  Intake/Output   Yesterday:  07/03 0701 - 07/04 0700 In: 3521.3 [P.O.:960; I.V.:1561.3; IV Piggyback:1000] Out: 2000  [Urine:1800; Emesis/NG output:200] This shift:  No intake/output data recorded.  Bowel function:  Flatus: YES  BM:  YES  Drain: (No drain)   Physical Exam:  General: Pt sleeping but awakens to be  in no acute distress.  Sitting up in bed.  Not toxic nor sickly  eyes: PERRL, normal EOM.  Sclera clear.  No icterus Neuro: CN II-XII intact w/o focal sensory/motor deficits. Lymph: No head/neck/groin lymphadenopathy Psych:  No delerium/psychosis/paranoia.  Oriented x 4.  Mildly spacey/slow to answer at first.  But otherwise completely oriented. HENT: Normocephalic, Mucus membranes moist.  No thrush Neck: Supple, No tracheal deviation.  No obvious thyromegaly Chest: No pain to chest wall compression.  Good respiratory excursion.  No audible wheezing CV:  Pulses intact.  Regular rhythm.  No major extremity edema MS: Normal AROM mjr joints.  No obvious deformity  Abdomen: Soft.  Mildy distended.  Nontender.  Incisions clean dry intact.  No ecchymosis or cellulitis.  No guarding.  No evidence of  peritonitis.  No incarcerated hernias.  Ext:   No deformity.  No mjr edema.  No cyanosis Skin: No petechiae / purpurea.  No major sores.  Warm and dry    Results:   Cultures: No results found for this or any previous visit (from the past 720 hour(s)).  Labs: Results for orders placed or performed during the hospital encounter of 06/17/22 (from the past 48 hour(s))  Potassium     Status: None   Collection Time: 06/21/22  4:17 AM  Result Value Ref Range   Potassium 3.7 3.5 - 5.1 mmol/L    Comment: Performed at [facility], [place] [address], [place], [place] [address]  CBC     Status: Abnormal   Collection Time: 06/21/22  4:17 AM  Result Value Ref Range   WBC 18.6 (H) 4.0 - 10.5 K/uL   RBC 4.00 3.87 - 5.11 MIL/uL   Hemoglobin 10.7 (L) 12.0 - 15.0 g/dL   HCT 33.7 (L) 36.0 - 46.0 %   MCV 84.3 80.0 - 100.0 fL   MCH 26.8 26.0 - 34.0 pg   MCHC 31.8 30.0 - 36.0 g/dL   RDW  23.5 (H) 11.5 - 15.5 %   Platelets 557 (H) 150 - 400 K/uL   nRBC 0.0 0.0 - 0.2 %    Comment: Performed at [facility], [place] [address] [place], [place] [address]  Creatinine, serum     Status: Abnormal   Collection Time: 06/21/22  4:17 AM  Result Value Ref Range   Creatinine, Ser 2.45 (H) 0.44 - 1.00 mg/dL   GFR, Estimated 19 (L) >60 mL/min    Comment: (NOTE) Calculated using the CKD-EPI Creatinine Equation (2021) Performed at [facility], [place] [address] [place], [place] [address]   Potassium     Status: None   Collection Time: 06/22/22  4:21 AM  Result Value Ref Range   Potassium 3.7 3.5 - 5.1 mmol/L    Comment: Performed at [facility], [place] [address], [place], [place] [address]  CBC     Status: Abnormal   Collection Time: 06/22/22  4:21 AM  Result Value Ref Range   WBC 11.5 (H) 4.0 - 10.5 K/uL   RBC 3.64 (L) 3.87 - 5.11 MIL/uL   Hemoglobin 9.7 (L) 12.0 - 15.0 g/dL   HCT 30.4 (L) 36.0 - 46.0 %   MCV 83.5 80.0 - 100.0 fL   MCH 26.6 26.0 - 34.0 pg   MCHC 31.9 30.0 - 36.0 g/dL   RDW 22.9 (H) 11.5 - 15.5 %   Platelets 485 (H) 150 - 400 K/uL   nRBC 0.0 0.0 - 0.2 %    Comment: Performed at [facility], [place] [address] [place], [place] [address]  Creatinine, serum     Status: Abnormal   Collection Time: 06/22/22  4:21 AM  Result Value Ref Range   Creatinine, Ser 1.75 (H) 0.44 - 1.00 mg/dL   GFR, Estimated 28 (L) >60 mL/min    Comment: (NOTE) Calculated using the CKD-EPI Creatinine Equation (2021) Performed at [facility], [place] [address] [place], [place] [address]     Imaging / Studies: DG Abd 2 Views  Result Date: 06/21/2022 CLINICAL DATA:  Vomiting, diarrhea EXAM: ABDOMEN - 2 VIEW COMPARISON:  04/20/2022 CT abdomen/pelvis FINDINGS: Borderline mildly prominent small bowel loops up to 3.0 cm with scattered air-fluid levels. A few scattered large bowel air-fluid levels. No  evidence of pneumatosis or  pneumoperitoneum. Blunting of the right costophrenic angle. No radiopaque nephrolithiasis. Mild lumbar spondylosis. IMPRESSION: Borderline mildly prominent small bowel loops with scattered air-fluid levels in the small and large bowel. Findings favor a mild adynamic ileus such as due to enterocolitis. A partial mild distal mechanical small bowel obstruction cannot be entirely excluded. Blunting of the right costophrenic angle, which could reflect small right pleural effusion versus pleuroparenchymal scarring. Electronically Signed   By: Ilona Sorrel M.D.   On: 06/21/2022 10:15    Medications / Allergies: per chart  Antibiotics: Anti-infectives (From admission, onward)    Start     Dose/Rate Route Frequency Ordered Stop   06/18/22 0200  cefoTEtan (CEFOTAN) 2 g in sodium chloride 0.9 % 100 mL IVPB        2 g 200 mL/hr over 30 Minutes Intravenous Every 12 hours 06/17/22 1754 06/18/22 0200   06/17/22 1400  neomycin (MYCIFRADIN) tablet 1,000 mg  Status:  Discontinued       See Hyperspace for full Linked Orders Report.   1,000 mg Oral 3 times per day 06/17/22 1153 06/17/22 1159   06/17/22 1400  metroNIDAZOLE (FLAGYL) tablet 1,000 mg  Status:  Discontinued       See Hyperspace for full Linked Orders Report.   1,000 mg Oral 3 times per day 06/17/22 1153 06/17/22 1159   06/17/22 1200  cefoTEtan (CEFOTAN) 2 g in sodium chloride 0.9 % 100 mL IVPB        2 g 200 mL/hr over 30 Minutes Intravenous On call to O.R. 06/17/22 1153 06/17/22 1441         Note: Portions of this report may have been transcribed using voice recognition software. Every effort was made to ensure accuracy; however, inadvertent computerized transcription errors may be present.   Any transcriptional errors that result from this process are unintentional.    Adin Hector, MD, FACS, MASCRS Esophageal, Gastrointestinal & Colorectal Surgery Robotic and Minimally Invasive Surgery  [facility], [place]  [place]. [address], [place], [place] [address] [phone] Fax [phone] Main  CONTACT INFORMATION:  Weekday (9AM-5PM): Call [facility] main office at [phone]  Weeknight (5PM-9AM) or Weekend/Holiday: Check [email or web address] (password " [email or web address]") for General Surgery [facility] coverage  (Please, do not use SecureChat as it is not reliable communication to operating surgeons for immediate patient care)      06/22/2022  7:44 AM

## 2022-06-23 LAB — POTASSIUM: Potassium: 3.6 mmol/L (ref 3.5–5.1)

## 2022-06-23 LAB — CREATININE, SERUM
Creatinine, Ser: 1.21 mg/dL — ABNORMAL HIGH (ref 0.44–1.00)
GFR, Estimated: 44 mL/min — ABNORMAL LOW (ref >60)

## 2022-06-23 MED ORDER — MAGNESIUM OXIDE -MG SUPPLEMENT 400 (240 MG) MG PO TABS
200.0000 mg | ORAL_TABLET | Freq: Every day | ORAL | Status: DC
  Administered 2022-06-23 – 2022-06-24 (×2): 200 mg via ORAL
  Filled 2022-06-23 (×2): qty 1

## 2022-06-23 MED ORDER — BIOTIN W/ VITAMINS C & E 1250-7.5-7.5 MCG-MG-UNT PO CHEW: 1.0000 | CHEWABLE_TABLET | Freq: Every day | ORAL | Status: DC

## 2022-06-23 MED ORDER — K PHOS MONO-SOD PHOS DI & MONO 155-852-130 MG PO TABS
500.0000 mg | ORAL_TABLET | Freq: Two times a day (BID) | ORAL | Status: DC
Start: 2022-06-23 — End: 2022-06-23
  Filled 2022-06-23: qty 2

## 2022-06-23 MED ORDER — K PHOS MONO-SOD PHOS DI & MONO 155-852-130 MG PO TABS
250.0000 mg | ORAL_TABLET | Freq: Two times a day (BID) | ORAL | Status: DC
  Administered 2022-06-23 – 2022-06-24 (×2): 250 mg via ORAL
  Filled 2022-06-23 (×5): qty 1

## 2022-06-23 MED ORDER — POLYETHYLENE GLYCOL 3350 17 G PO PACK
17.0000 g | PACK | Freq: Two times a day (BID) | ORAL | Status: DC
  Administered 2022-06-23: 17 g via ORAL
  Filled 2022-06-23 (×2): qty 1

## 2022-06-23 MED ORDER — MAGNESIUM 200 MG PO TABS: 1.0000 | ORAL_TABLET | Freq: Every day | ORAL | Status: DC

## 2022-06-23 NOTE — Progress Notes (Signed)
Physical Therapy Treatment Patient Details Name: Ann [name] MRN: [id number] DOB: Dec 13, 1935 Today's Date: 06/23/2022   History of Present Illness patient is a 87 year old female admitted with colon cancer. S/P R colectomy 6/29. Hx of CKD    PT Comments    Pt is progressing well with mobility, she tolerated increased ambulation distance of 330' with RW, no loss of balance. Pt denied pain with ambulation. She was encouraged to ambulate at least TID in the hallway. Pt reports she can borrow needed DME (RW and bedside commode) from a friend.     Recommendations for follow up therapy are one component of a multi-disciplinary discharge planning process, led by the attending physician.  Recommendations may be updated based on patient status, additional functional criteria and insurance authorization.  Follow Up Recommendations  No PT follow up     Assistance Recommended at Discharge PRN  Patient can return home with the following Assistance with cooking/housework   Equipment Recommendations  None recommended by PT (pt stated she can borrow DME if needed)    Recommendations for Other Services       Precautions / Restrictions Precautions Precautions: Fall;Other (comment) Precaution Comments: abdominal surgery Restrictions Weight Bearing Restrictions: No     Mobility  Bed Mobility               General bed mobility comments: up in recliner    Transfers Overall transfer level: Modified independent Equipment used: None Transfers: Sit to/from Stand Sit to Stand: Supervision           General transfer comment: Increased time, pushed up using arm rests    Ambulation/Gait Ambulation/Gait assistance: Modified independent (Device/Increase time) Gait Distance (Feet): 330 Feet Assistive device: Rolling walker (2 wheels) Gait Pattern/deviations: Step-through pattern, Decreased stride length Gait velocity: decreased     General Gait Details: steady, no loss of balance,  denied pain with walking   Stairs             Wheelchair Mobility    Modified Rankin (Stroke Patients Only)       Balance Overall balance assessment: Modified Independent                                          Cognition Arousal/Alertness: Awake/alert Behavior During Therapy: WFL for tasks assessed/performed Overall Cognitive Status: Within Functional Limits for tasks assessed                                          Exercises      General Comments        Pertinent Vitals/Pain Pain Assessment Pain Assessment: No/denies pain Pain Score: 0-No pain    Home Living                          Prior Function            PT Goals (current goals can now be found in the care plan section) Acute Rehab PT Goals Patient Stated Goal: none stated PT Goal Formulation: With patient Time For Goal Achievement: 07/04/22 Potential to Achieve Goals: Good Progress towards PT goals: Goals met and updated - see care plan    Frequency    Min 3X/week      PT Plan Current  plan remains appropriate    Co-evaluation              AM-PAC PT "6 Clicks" Mobility   Outcome Measure  Help needed turning from your back to your side while in a flat bed without using bedrails?: None Help needed moving from lying on your back to sitting on the side of a flat bed without using bedrails?: None Help needed moving to and from a bed to a chair (including a wheelchair)?: None Help needed standing up from a chair using your arms (e.g., wheelchair or bedside chair)?: None Help needed to walk in hospital room?: None Help needed climbing 3-5 steps with a railing? : A Little 6 Click Score: 23    End of Session   Activity Tolerance: Patient tolerated treatment well Patient left: with call bell/phone within reach;with family/visitor present;in chair Nurse Communication: Mobility status PT Visit Diagnosis: Unsteadiness on feet  (R26.81);Pain Pain - part of body:  (abdomen)     Time: 5462-7035 PT Time Calculation (min) (ACUTE ONLY): 13 min  Charges:  $Gait Training: 8-22 mins                     Ann [name] PT 06/23/2022  Acute Rehabilitation Services  Office [phone]

## 2022-06-23 NOTE — Progress Notes (Signed)
Calorie Count Note  48 hour calorie count ordered. Day 1 below.  Diet: Dysphagia 1 7/4, Now on Heart Healthy 7/5 Supplements:  -Boost Breeze po TID, each supplement provides 250 kcal and 9 grams of protein  -Prosource Plus PO TID, each provides 100 kcals and 15g protein  -Beneprotein powder TID, each provides 25 kcals and 6g protein  -Unjury chicken Soup 4 oz BID, Each serving provides 50 kcals and 10g protein  7/4-7/5: Breakfast: 60 kcals, 1g protein Lunch: 184 kcals, 10g protein Dinner (7/4): 45 kcals, 5g protein Supplements: 3 Prosource Plus (300 kcals, 45g protein) 1 Unjury (50 kcals, 10g protein)  Total intake: 639 kcal (42% of minimum estimated needs)  71g protein (94% of minimum estimated needs)  Nutrition Dx: Increased nutrient needs related to cancer and cancer related treatments as evidenced by estimated needs.    Goal: Pt to meet >/= 90% of their estimated nutrition needs   Intervention:  -Continue Calorie count - continue Prosource Plus, Boost Breeze, Beneprotein and Unjury supplements  Ann Bibles, MS, RD, LDN Inpatient Clinical Dietitian Contact information available via Amion

## 2022-06-23 NOTE — Care Management Important Message (Signed)
Important Message  Patient Details IM Letter given to the Patient Name: Ann [name] MRN: [id number] Date of Birth: 03-26-1935   Medicare Important Message Given:  Yes     Kerin Salen 06/23/2022, 12:56 PM

## 2022-06-23 NOTE — Progress Notes (Addendum)
Ann [name] [id number] [birth year]  CARE TEAM:  PCP: Hoyt Koch, MD  Outpatient Care Team: Patient Care Team: Hoyt Koch, MD as PCP - General (Internal Medicine) [facility] 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 No obvious metastatic disease on visceral parietal peritoneum or liver.   It is an isoperistaltic ileocolonic anastomosis that rests in the periumbilical region.  Assessment  Ileus with dehydration - resolving  [facility] Stay = 6 days)  Plan:  CAT scan to my view consistent with mild ileus and some dilation.  No twisting or obstruction at the anastomosis.  No fluid collection abscess or hematoma.  No perforation.  Stomach mildly enlarged but not massively so arguing against severe gastroparesis.  Radiology underwhelmed.  Mostly reassuring.  Placed on standing nausea medications.  Try low-dose metoclopramide for 2 days to see if that helps with gastric emptying.   Continue ondansetron and prochlorperazine for backup.    Bowel regimen.  Still evidence of ileus with dilated small bowel loops.  No severe diarrhea.  Increase MiraLAX to twice a day.    Follow mental status.    Switched narcotics around without much change in mental status but still relatively okay.  Hopefully improved.  I worry she could be sundowning since she usually is up at night with emesis.  No vomiting last night hopeful sign.  Elevated creatinine concerning for AKI.  Patient was never oliguric.  Creatinine normalizing back to her baseline of CKD 2 Follow.  Continue strict I&O.  Low potassium.  Not severe at this time.  Hold off on aggressive supplementation with increased creatinine but follow closely.  Switch bowel regimen.  Try just 1 daily dose of MiraLAX to help thicken and solidify bowels.  No more than that for now.  Pepto for backup.  Do not want to do Imodium just yet.  Patient still prefers to wear diapers since she had overflow incontinence when she had many loose bowel movements early in admission.  Things have slowed down but she is being cautious  ERAS pathway.  Anemia.  Iron deficiency from chronic blood loss in the setting of acute blood loss anemia after surgery.  Given IV iron 7/2 since I suspect her absorption will be fair at age 87.  Follow-up on pathology.  -VTE prophylaxis- SCDs.  Switch from enoxaparin to heparin given elevated creatinine.  -Mobilize as tolerated to help recovery.  Being seen by physical therapy - they felt she was walking okay on the hallways with a walker.  Try to encourage her to walk more.  Discharge planning with home health for at least home safety visit and occupational therapy.    I updated the patient's status to the patient and nurse  Recommendations were made.  Questions were answered.  They expressed understanding & appreciation.   Disposition:  Disposition:  The patient is from: Home  Anticipate discharge to:  Home with  Home Health  Anticipated Date of Discharge is:  July 6,2023    Barriers to discharge:  Pending Clinical improvement (more likely than not)  Patient currently is NOT MEDICALLY STABLE for discharge from the hospital from a surgery standpoint.      I reviewed nursing notes, last 24 h vitals and pain scores, last 48 h intake and output, last 24 h labs and trends, and last 24 h imaging results. I have reviewed this patient's available data, including medical history, events of note, test results, etc as part of my evaluation.  A significant portion of that time was spent in counseling.  Care during the described time interval was provided by me.  This care required moderate level of medical decision making.  06/23/2022    Subjective: (Chief complaint)  No major events.  CT scan done yesterday relatively underwhelming.  Had bowel movement.  Feeling better  Daughter in room  No emesis!  Walked with physical therapy and they feel that she is doing relatively well.  Objective:  Vital signs:  Vitals:   06/22/22 0500 06/22/22 1227 06/22/22 2155 06/23/22 0609  BP:  (!) 152/63 (!) 147/72 (!) 142/76  Pulse:  76 74 71  Resp:   16 18  Temp:  (!) 97.4 F (36.3 C) 97.8 F (36.6 C) 98.1 F (36.7 C)  TempSrc:  Oral Oral Oral  SpO2:  100% 100% 100%  Weight: 47.2 kg     Height:        Last BM Date : 06/22/22  Intake/Output   Yesterday:  07/04 0701 - 07/05 0700 In: 1131.7 [P.O.:60; I.V.:1071.7] Out: 750 [Urine:750] This shift:  No intake/output data recorded.  Bowel function:  Flatus: YES  BM:  YES  Drain: (No drain)   Physical Exam:  General: Pt sleeping but awakens to be  in no acute distress.  Sitting up in bed.  Not toxic nor sickly  eyes: PERRL, normal EOM.  Sclera clear.  No icterus Neuro: CN II-XII intact w/o focal sensory/motor deficits. Lymph: No head/neck/groin lymphadenopathy Psych:  No delerium/psychosis/paranoia.  Oriented x 4.  Mildly spacey/slow to  answer at first.  But otherwise completely oriented. HENT: Normocephalic, Mucus membranes moist.  No thrush Neck: Supple, No tracheal deviation.  No obvious thyromegaly Chest: No pain to chest wall compression.  Good respiratory excursion.  No audible wheezing CV:  Pulses intact.  Regular rhythm.  No major extremity edema MS: Normal AROM mjr joints.  No obvious deformity  Abdomen: Soft.  Mildy distended.  Nontender.  Incisions clean dry intact.  No ecchymosis or cellulitis.  No guarding.  No evidence of peritonitis.  No incarcerated hernias.  Ext:   No deformity.  No mjr edema.  No cyanosis Skin: No petechiae / purpurea.  No major sores.  Warm and dry    Results:   Cultures: No results found for this or any previous visit (from the past 720 hour(s)).  Labs: Results for orders placed or performed during the hospital encounter of 06/17/22 (from the past 48 hour(s))  Potassium     Status: None   Collection Time: 06/22/22  4:21 AM  Result Value  Ref Range   Potassium 3.7 3.5 - 5.1 mmol/L    Comment: Performed at [facility], [place] [address] [place], [place] [address]  CBC     Status: Abnormal   Collection Time: 06/22/22  4:21 AM  Result Value Ref Range   WBC 11.5 (H) 4.0 - 10.5 K/uL   RBC 3.64 (L) 3.87 - 5.11 MIL/uL   Hemoglobin 9.7 (L) 12.0 - 15.0 g/dL   HCT 30.4 (L) 36.0 - 46.0 %   MCV 83.5 80.0 - 100.0 fL   MCH 26.6 26.0 - 34.0 pg   MCHC 31.9 30.0 - 36.0 g/dL   RDW 22.9 (H) 11.5 - 15.5 %   Platelets 485 (H) 150 - 400 K/uL   nRBC 0.0 0.0 - 0.2 %    Comment: Performed at [facility], [place] [address] [place], [place] [address]  Creatinine, serum     Status: Abnormal   Collection Time: 06/22/22  4:21 AM  Result Value Ref Range   Creatinine, Ser 1.75 (H) 0.44 - 1.00 mg/dL   GFR, Estimated 28 (L) >60 mL/min    Comment: (NOTE) Calculated using the CKD-EPI Creatinine Equation (2021) Performed at [facility], [place]  [address], [place], [place] [address]   Potassium     Status: None   Collection Time: 06/23/22  4:47 AM  Result Value Ref Range   Potassium 3.6 3.5 - 5.1 mmol/L    Comment: Performed at [facility], [place] [address], [place], [place] [address]  Creatinine, serum     Status: Abnormal   Collection Time: 06/23/22  4:47 AM  Result Value Ref Range   Creatinine, Ser 1.21 (H) 0.44 - 1.00 mg/dL   GFR, Estimated 44 (L) >60 mL/min    Comment: (NOTE) Calculated using the CKD-EPI Creatinine Equation (2021) Performed at [facility], [place] [address], [place], [place] [address]     Imaging / Studies: CT ABDOMEN PELVIS WO CONTRAST  Result Date: 06/22/2022 CLINICAL DATA:  Status post right colectomy for colon cancer of the hepatic flexure on 06/17/2022. Abdominal pain with postop vomiting. EXAM: CT ABDOMEN AND PELVIS WITHOUT CONTRAST TECHNIQUE: Multidetector CT imaging of the abdomen and pelvis was performed following the standard protocol without IV contrast. RADIATION DOSE REDUCTION: This exam was performed according to the departmental dose-optimization program which includes automated exposure control, adjustment of the mA and/or kV according to patient size and/or use of iterative reconstruction technique. COMPARISON:  CT abdomen pelvis 04/21/2019. FINDINGS: Lower chest: No acute abnormality. Thin linear scarring in the left lung base. Hepatobiliary: No focal liver abnormality is seen. A few punctate gallstones versus gallbladder wall calcifications are unchanged compared to 04/20/2022. No gallbladder wall thickening or bile duct dilatation. Pancreas: Unremarkable. No pancreatic ductal dilatation or surrounding inflammatory changes. Spleen: Normal in size without focal abnormality. Adrenals/Urinary Tract: Adrenal glands are unremarkable. Mild bilateral hydronephrosis versus parapelvic cysts. No ureteral dilatation. No focal lesion or nephrolithiasis. Bladder is  unremarkable. Stomach/Bowel: Stomach is within normal limits. Interval right colectomy with ileocolic anastomosis in the mid abdomen. The distal ileal loops immediately upstream of the anastomosis are fluid-filled and dilated, measuring up to 3 cm in diameter (series 2, image 40). Air-fluid levels are noted. Jejunal loops are normal caliber. Oral contrast reaches the distal jejunum. The remaining transverse colon descending colon and sigmoid colon are normal caliber. The rectum is mildly distended with liquid stool. No bowel wall thickening. Vascular/Lymphatic: Aortic atherosclerosis. No enlarged abdominal or  pelvic lymph nodes. Reproductive: Uterus and bilateral adnexa are unremarkable. Other: A few tiny locules of non dependent air noted in the along the anterior abdominal wall in the lower abdomen and pelvis consistent with recent surgery. No abdominopelvic ascites or loculated fluid collection. Small amount of subcutaneous emphysema is noted in the anterior and lateral abdominal wall bilaterally, consistent with recent surgery. No subcutaneous fluid collection. Musculoskeletal: No acute or significant osseous findings. IMPRESSION: 1. Expected postoperative changes of right colectomy with ileocolonic anastomosis in the mid abdomen. 2. The distal ileal bowel loops immediately upstream of the anastomosis are fluid-filled and dilated, consistent with adynamic ileus. 3. No CT findings of loculated fluid collection to suggest abscess, as queried. 4. Small gallstones versus foci of calcification of the gallbladder wall, unchanged. No CT findings of cholecystitis. 5. Mild bilateral hydronephrosis versus small parapelvic cysts, unchanged. Electronically Signed   By: Ileana Roup M.D.   On: 06/22/2022 13:55   DG Abd 2 Views  Result Date: 06/21/2022 CLINICAL DATA:  Vomiting, diarrhea EXAM: ABDOMEN - 2 VIEW COMPARISON:  04/20/2022 CT abdomen/pelvis FINDINGS: Borderline mildly prominent small bowel loops up to 3.0 cm with  scattered air-fluid levels. A few scattered large bowel air-fluid levels. No evidence of pneumatosis or pneumoperitoneum. Blunting of the right costophrenic angle. No radiopaque nephrolithiasis. Mild lumbar spondylosis. IMPRESSION: Borderline mildly prominent small bowel loops with scattered air-fluid levels in the small and large bowel. Findings favor a mild adynamic ileus such as due to enterocolitis. A partial mild distal mechanical small bowel obstruction cannot be entirely excluded. Blunting of the right costophrenic angle, which could reflect small right pleural effusion versus pleuroparenchymal scarring. Electronically Signed   By: Ilona Sorrel M.D.   On: 06/21/2022 10:15    Medications / Allergies: per chart  Antibiotics: Anti-infectives (From admission, onward)    Start     Dose/Rate Route Frequency Ordered Stop   06/18/22 0200  cefoTEtan (CEFOTAN) 2 g in sodium chloride 0.9 % 100 mL IVPB        2 g 200 mL/hr over 30 Minutes Intravenous Every 12 hours 06/17/22 1754 06/18/22 0200   06/17/22 1400  neomycin (MYCIFRADIN) tablet 1,000 mg  Status:  Discontinued       See Hyperspace for full Linked Orders Report.   1,000 mg Oral 3 times per day 06/17/22 1153 06/17/22 1159   06/17/22 1400  metroNIDAZOLE (FLAGYL) tablet 1,000 mg  Status:  Discontinued       See Hyperspace for full Linked Orders Report.   1,000 mg Oral 3 times per day 06/17/22 1153 06/17/22 1159   06/17/22 1200  cefoTEtan (CEFOTAN) 2 g in sodium chloride 0.9 % 100 mL IVPB        2 g 200 mL/hr over 30 Minutes Intravenous On call to O.R. 06/17/22 1153 06/17/22 1441         Note: Portions of this report may have been transcribed using voice recognition software. Every effort was made to ensure accuracy; however, inadvertent computerized transcription errors may be present.   Any transcriptional errors that result from this process are unintentional.    Adin Hector, MD, FACS, MASCRS Esophageal, Gastrointestinal &  Colorectal Surgery Robotic and Minimally Invasive Surgery  [facility], [place]. [address] [place], [place] [address] [phone] Fax [phone] Main  CONTACT INFORMATION:  Weekday (9AM-5PM): Call [facility] main office at [phone]  Weeknight (5PM-9AM) or Weekend/Holiday: Check [email or web address] (password " [email or web address]")  for General Surgery [facility] coverage  (Please, do not use SecureChat as it is not reliable communication to operating surgeons for immediate patient care)      06/23/2022  7:48 AM

## 2022-06-24 ENCOUNTER — Other Ambulatory Visit: Payer: Self-pay

## 2022-06-24 LAB — POTASSIUM: Potassium: 3.8 mmol/L (ref 3.5–5.1)

## 2022-06-24 LAB — CREATININE, SERUM
Creatinine, Ser: 1.42 mg/dL — ABNORMAL HIGH (ref 0.44–1.00)
GFR, Estimated: 36 mL/min — ABNORMAL LOW (ref >60)

## 2022-06-24 LAB — LIPASE, BLOOD: Lipase: 52 U/L — ABNORMAL HIGH (ref 11–51)

## 2022-06-24 MED ORDER — SODIUM CHLORIDE 0.9% FLUSH: 3.0000 mL | Freq: Two times a day (BID) | INTRAVENOUS | Status: DC

## 2022-06-24 MED ORDER — HYDROCODONE-ACETAMINOPHEN 5-325 MG PO TABS: 1.0000 | ORAL_TABLET | ORAL | Status: DC | PRN

## 2022-06-24 MED ORDER — PANTOPRAZOLE SODIUM 40 MG PO TBEC: 80.0000 mg | DELAYED_RELEASE_TABLET | Freq: Every day | ORAL | Status: DC

## 2022-06-24 MED ORDER — LACTATED RINGERS IV BOLUS: 1000.0000 mL | Freq: Three times a day (TID) | INTRAVENOUS | Status: DC | PRN

## 2022-06-24 MED ORDER — UNJURY CHICKEN SOUP POWDER: 4.0000 [oz_av] | Freq: Three times a day (TID) | ORAL | Status: DC

## 2022-06-24 MED ORDER — METOCLOPRAMIDE HCL 5 MG PO TABS
5.0000 mg | ORAL_TABLET | Freq: Three times a day (TID) | ORAL | Status: DC
  Administered 2022-06-24 (×4): 5 mg via ORAL
  Filled 2022-06-24 (×4): qty 1

## 2022-06-24 MED ORDER — SODIUM CHLORIDE 0.9 % IV SOLN: 250.0000 mL | INTRAVENOUS | Status: DC | PRN

## 2022-06-24 MED ORDER — SODIUM CHLORIDE 0.9% FLUSH: 3.0000 mL | INTRAVENOUS | Status: DC | PRN

## 2022-06-24 MED ORDER — PANTOPRAZOLE SODIUM 40 MG PO TBEC
40.0000 mg | DELAYED_RELEASE_TABLET | Freq: Every day | ORAL | Status: DC
  Administered 2022-06-24: 40 mg via ORAL
  Filled 2022-06-24: qty 1

## 2022-06-24 MED ORDER — LACTATED RINGERS IV BOLUS
1000.0000 mL | Freq: Once | INTRAVENOUS | Status: AC
  Administered 2022-06-24: 1000 mL via INTRAVENOUS

## 2022-06-24 MED ORDER — POLYETHYLENE GLYCOL 3350 17 G PO PACK
17.0000 g | PACK | Freq: Every day | ORAL | Status: DC
  Administered 2022-06-24: 17 g via ORAL
  Filled 2022-06-24: qty 1

## 2022-06-24 MED ORDER — FAMOTIDINE 20 MG PO TABS
20.0000 mg | ORAL_TABLET | Freq: Every day | ORAL | Status: DC
  Administered 2022-06-24: 20 mg via ORAL
  Filled 2022-06-24: qty 1

## 2022-06-24 NOTE — Progress Notes (Signed)
Physical Therapy Treatment and Discharge from Acute PT Patient Details Name: Ann [name] MRN: [id number] DOB: 1935/07/31 Today's Date: 06/24/2022   History of Present Illness patient is a 87 year old female admitted with colon cancer. S/P R colectomy 6/29. Hx of CKD    PT Comments    Pt up in bathroom on arrival.  Pt ambulated in hallway with RW without any assist.  Pt has been ambulating with daughter and per pt, daughter will be assisting her as needed upon d/c.  Pt has met acute PT goals and agreeable to continue mobilizing in hospital with daughter.    Recommendations for follow up therapy are one component of a multi-disciplinary discharge planning process, led by the attending physician.  Recommendations may be updated based on patient status, additional functional criteria and insurance authorization.  Follow Up Recommendations  No PT follow up     Assistance Recommended at Discharge PRN  Patient can return home with the following Assistance with cooking/housework   Equipment Recommendations  None recommended by PT    Recommendations for Other Services       Precautions / Restrictions Precautions Precautions: Fall;Other (comment) Precaution Comments: abdominal surgery Restrictions Weight Bearing Restrictions: No     Mobility  Bed Mobility               General bed mobility comments: up in bathroom    Transfers Overall transfer level: Modified independent Equipment used: None                    Ambulation/Gait Ambulation/Gait assistance: Modified independent (Device/Increase time) Gait Distance (Feet): 400 Feet Assistive device: Rolling walker (2 wheels) Gait Pattern/deviations: Step-through pattern, Decreased stride length       General Gait Details: steady, no loss of balance, denied pain with walking   Stairs             Wheelchair Mobility    Modified Rankin (Stroke Patients Only)       Balance                                             Cognition Arousal/Alertness: Awake/alert Behavior During Therapy: WFL for tasks assessed/performed Overall Cognitive Status: Within Functional Limits for tasks assessed                                          Exercises      General Comments        Pertinent Vitals/Pain Pain Assessment Pain Assessment: No/denies pain    Home Living                          Prior Function            PT Goals (current goals can now be found in the care plan section) Progress towards PT goals: Goals met/education completed, patient discharged from PT    Frequency    Min 3X/week      PT Plan Current plan remains appropriate    Co-evaluation              AM-PAC PT "6 Clicks" Mobility   Outcome Measure  Help needed turning from your back to your side while in a flat bed without using bedrails?:  None Help needed moving from lying on your back to sitting on the side of a flat bed without using bedrails?: None Help needed moving to and from a bed to a chair (including a wheelchair)?: None Help needed standing up from a chair using your arms (e.g., wheelchair or bedside chair)?: None Help needed to walk in hospital room?: None Help needed climbing 3-5 steps with a railing? : A Little 6 Click Score: 23    End of Session   Activity Tolerance: Patient tolerated treatment well Patient left: in bed;with call bell/phone within reach;with family/visitor present   PT Visit Diagnosis: Difficulty in walking, not elsewhere classified (R26.2)     Time: 1051-1101 PT Time Calculation (min) (ACUTE ONLY): 10 min  Charges:  $Gait Training: 8-22 mins                     Arlyce Dice, DPT Physical Therapist Acute Rehabilitation Services Preferred contact method: Secure Chat Weekend Pager Only: [phone] Office: [place] 06/24/2022, 11:51 AM

## 2022-06-24 NOTE — Progress Notes (Signed)
GI Tumor Board patient referral:   Ann [name]  [birth year] [id number]  CARE TEAM: Patient Care Team: Hoyt Koch, MD as PCP - General (Internal Medicine) [facility] Doran Stabler, MD as Consulting Physician (Gastroenterology) Michael Boston, MD as Consulting Physician (General Surgery) Zadie Rhine Clent Demark, MD as Consulting Physician (Ophthalmology)  Diagnosis: Colon cancer, pT3pN0 - ?survivalpathway   MD Care Team:  Michael Boston, MD - surgery/. HDanis, GI  Focus of discussion: Radiology & Pathology reviews - comprehensive   Please send to GI Tumor Coordinator Santiago Glad Maribel)  in [place] message and attach the medical record to it

## 2022-06-24 NOTE — Progress Notes (Signed)
Ann [name] [id number] 1935/05/11  CARE TEAM:  PCP: Hoyt Koch, MD  Outpatient Care Team: Patient Care Team: Hoyt Koch, MD as PCP - General (Internal Medicine) [facility] 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 No obvious metastatic disease on visceral parietal peritoneum or liver.   It is an isoperistaltic ileocolonic anastomosis that rests in the periumbilical region.  Assessment  Ileus with dehydration - resolving  [facility] Stay = 7 days)  Plan:  Nightly emesis.  She suspects its more reflux related.  Place back on Protonix.  Add nightly famotidine and see if that helps.  Keeping head of bed up.  Resume metoclopramide.  See if that helps with gastric emptying placed on standing nausea medications.  Try low-dose  metoclopramide for 2 days to see if that helps with gastric emptying.  Continue ondansetron and prochlorperazine for backup.    Bowel regimen.  Still evidence of ileus with dilated small bowel loops.  No severe diarrhea.  Increase MiraLAX to twice a day.    Follow mental status.    Switched narcotics around without much change in mental status but still relatively okay.  Hopefully improved.  I worry she could be sundowning since she usually is up at night with emesis.  No vomiting last night hopeful sign.  Elevated creatinine concerning for AKI.  Patient was never oliguric.  Creatinine normalizing back to her baseline of CKD 2 -  Follow.  Continue strict I&O.  Low potassium.  Potassium supplementation.  Use K-Phos since less bothersome from a pill standpoint  Bowel regimen.  Back on home magnesium.  Back MiraLAX to once a day.  That combination hopefully should work.  Not having any overflow incontinence issues.    ERAS pathway.  Anemia.  Iron deficiency from chronic blood loss in the setting of acute blood loss anemia after surgery.  Given IV iron 7/2 since I suspect her absorption will be fair at age 87.  Follow-up on pathology.  -VTE prophylaxis- SCDs.  Switch from enoxaparin to heparin given elevated creatinine.  -Mobilize as tolerated to help recovery.  Being seen by physical therapy - they felt she was walking okay on the hallways with a walker.  Try to encourage her to walk more.   Discharge planning with home health for at least home safety visit and occupational therapy.    I updated the patient's status to the patient and nurse  Recommendations were made.  Questions were answered.  They expressed understanding & appreciation.   Disposition:  Disposition:  The patient is from: Home  Anticipate discharge to:  Home with Home Health  Anticipated Date of Discharge is:  July 7,2023    Barriers to discharge:  Pending Clinical improvement (more likely than not)  Patient  currently is NOT MEDICALLY STABLE for discharge from the hospital from a surgery standpoint.      I reviewed nursing notes, last 24 h vitals and pain scores, last 48 h intake and output, last 24 h labs and trends, and last 24 h imaging results. I have reviewed this patient's available data, including medical history, events of note, test results, etc as part of my evaluation.  A significant portion of that time was spent in counseling.  Care during the described time interval was provided by me.  This care required moderate level of medical decision making.  06/24/2022    Subjective: (Chief complaint)  Patient felt like she had a good day.  Walked more than ever.  Appetite down but liking solid food options.  Again spit up last night.  Not a large volume of emesis.  She wondered if she had had some heartburn related to it.  Numerous bowel movements.  No fecal incontinence.  Objective:  Vital signs:  Vitals:   06/23/22 1336 06/23/22 2022 06/24/22 0521 06/24/22 0700  BP: 126/76 132/74 133/77   Pulse: 93 81 77   Resp: '18 18 18   '$ Temp: (!) 97.5 F (36.4 C) 97.6 F (36.4 C) (!) 97.4 F (36.3 C)   TempSrc: Oral Oral Oral   SpO2: 100% 99% 100%   Weight:    46.2 kg  Height:        Last BM Date : 06/23/22  Intake/Output   Yesterday:  07/05 0701 - 07/06 0700 In: 8299 [P.O.:570; I.V.:877] Out: 200 [Urine:200] This shift:  No intake/output data recorded.  Bowel function:  Flatus: YES  BM:  YES  Drain: (No drain)   Physical Exam:  General: Pt alert in no acute distress.  Sitting up in bed.  Not toxic nor sickly  eyes: PERRL, normal EOM.  Sclera clear.  No icterus Neuro: CN II-XII intact w/o focal sensory/motor deficits. Lymph: No head/neck/groin lymphadenopathy Psych:  No delerium/psychosis/paranoia.  Oriented x 4.  Mildly spacey/slow to answer at first.  But otherwise completely oriented. HENT: Normocephalic, Mucus membranes moist.  No thrush Neck: Supple, No  tracheal deviation.  No obvious thyromegaly Chest: No pain to chest wall compression.  Good respiratory excursion.  No audible wheezing CV:  Pulses intact.  Regular rhythm.  No major extremity edema MS: Normal AROM mjr joints.  No obvious deformity  Abdomen: Soft.  Nondistended.  Nontender.  Incisions clean dry intact.  No ecchymosis or cellulitis.  No guarding.  No evidence of peritonitis.  No incarcerated hernias.  Ext:   No deformity.  No mjr edema.  No cyanosis Skin: No petechiae / purpurea.  No major sores.  Warm and dry    Results:   Cultures: No results found for this or any previous visit (from the past 720 hour(s)).  Labs: Results for orders placed or performed during the hospital encounter of 06/17/22 (from the past 48 hour(s))  Potassium     Status: None   Collection Time: 06/23/22  4:47 AM  Result Value Ref Range   Potassium 3.6 3.5 - 5.1 mmol/L    Comment: Performed at [facility],  [place] [address], [place], [place] [address]  Creatinine, serum     Status: Abnormal   Collection Time: 06/23/22  4:47 AM  Result Value Ref Range   Creatinine, Ser 1.21 (H) 0.44 - 1.00 mg/dL   GFR, Estimated 44 (L) >60 mL/min    Comment: (NOTE) Calculated using the CKD-EPI Creatinine Equation (2021) Performed at [facility], [place] [address], [place], [place] [address]   Creatinine, serum     Status: Abnormal   Collection Time: 06/24/22  4:57 AM  Result Value Ref Range   Creatinine, Ser 1.42 (H) 0.44 - 1.00 mg/dL   GFR, Estimated 36 (L) >60 mL/min    Comment: (NOTE) Calculated using the CKD-EPI Creatinine Equation (2021) Performed at [facility], [place] [address] [place], [place] [address]   Potassium     Status: None   Collection Time: 06/24/22  4:57 AM  Result Value Ref Range   Potassium 3.8 3.5 - 5.1 mmol/L    Comment: Performed at [facility], [place] [address] [place], [place] [address]    Imaging /  Studies: CT ABDOMEN PELVIS WO CONTRAST  Result Date: 06/22/2022 CLINICAL DATA:  Status post right colectomy for colon cancer of the hepatic flexure on 06/17/2022. Abdominal pain with postop vomiting. EXAM: CT ABDOMEN AND PELVIS WITHOUT CONTRAST TECHNIQUE: Multidetector CT imaging of the abdomen and pelvis was performed following the standard protocol without IV contrast. RADIATION DOSE REDUCTION: This exam was performed according to the departmental dose-optimization program which includes automated exposure control, adjustment of the mA and/or kV according to patient size and/or use of iterative reconstruction technique. COMPARISON:  CT abdomen pelvis 04/21/2019. FINDINGS: Lower chest: No acute abnormality. Thin linear scarring in the left lung base. Hepatobiliary: No focal liver abnormality is seen. A few punctate gallstones versus gallbladder wall calcifications are unchanged compared to 04/20/2022. No gallbladder wall thickening or bile duct dilatation. Pancreas: Unremarkable. No pancreatic ductal dilatation or surrounding inflammatory changes. Spleen: Normal in size without focal abnormality. Adrenals/Urinary Tract: Adrenal glands are unremarkable. Mild bilateral hydronephrosis versus parapelvic cysts. No ureteral dilatation. No focal lesion or nephrolithiasis. Bladder is unremarkable. Stomach/Bowel: Stomach is within normal limits. Interval right colectomy with ileocolic anastomosis in the mid abdomen. The distal ileal loops immediately upstream of the anastomosis are fluid-filled and dilated, measuring up to 3 cm in diameter (series 2, image 40). Air-fluid levels are noted. Jejunal loops are normal caliber. Oral contrast reaches the distal jejunum. The remaining transverse colon descending colon and sigmoid colon are normal caliber. The rectum is mildly distended with liquid stool. No bowel wall thickening. Vascular/Lymphatic: Aortic atherosclerosis. No enlarged abdominal or pelvic lymph nodes.  Reproductive: Uterus and bilateral adnexa are unremarkable. Other: A few tiny locules of non dependent air noted in the along the anterior abdominal wall in the lower abdomen and pelvis consistent with recent surgery. No abdominopelvic ascites or loculated fluid collection. Small amount of subcutaneous emphysema is noted in the anterior and lateral abdominal wall bilaterally, consistent with recent surgery. No subcutaneous fluid collection. Musculoskeletal: No acute or significant osseous findings. IMPRESSION: 1. Expected postoperative changes of right colectomy with ileocolonic anastomosis in the mid abdomen. 2. The distal ileal bowel loops immediately upstream of the anastomosis are fluid-filled and dilated, consistent with adynamic ileus. 3. No CT findings of loculated fluid collection to suggest abscess, as queried. 4. Small gallstones versus foci of calcification of the gallbladder wall, unchanged. No CT findings of cholecystitis. 5. Mild bilateral hydronephrosis versus small  parapelvic cysts, unchanged. Electronically Signed   By: Ileana Roup M.D.   On: 06/22/2022 13:55    Medications / Allergies: per chart  Antibiotics: Anti-infectives (From admission, onward)    Start     Dose/Rate Route Frequency Ordered Stop   06/18/22 0200  cefoTEtan (CEFOTAN) 2 g in sodium chloride 0.9 % 100 mL IVPB        2 g 200 mL/hr over 30 Minutes Intravenous Every 12 hours 06/17/22 1754 06/18/22 0200   06/17/22 1400  neomycin (MYCIFRADIN) tablet 1,000 mg  Status:  Discontinued       See Hyperspace for full Linked Orders Report.   1,000 mg Oral 3 times per day 06/17/22 1153 06/17/22 1159   06/17/22 1400  metroNIDAZOLE (FLAGYL) tablet 1,000 mg  Status:  Discontinued       See Hyperspace for full Linked Orders Report.   1,000 mg Oral 3 times per day 06/17/22 1153 06/17/22 1159   06/17/22 1200  cefoTEtan (CEFOTAN) 2 g in sodium chloride 0.9 % 100 mL IVPB        2 g 200 mL/hr over 30 Minutes Intravenous On call to  O.R. 06/17/22 1153 06/17/22 1441         Note: Portions of this report may have been transcribed using voice recognition software. Every effort was made to ensure accuracy; however, inadvertent computerized transcription errors may be present.   Any transcriptional errors that result from this process are unintentional.    Adin Hector, MD, FACS, MASCRS Esophageal, Gastrointestinal & Colorectal Surgery Robotic and Minimally Invasive Surgery  [facility], [place]  [place] [address] [place], [place] [address] [phone] Fax [phone] Main  CONTACT INFORMATION:  Weekday (9AM-5PM): Call [facility] main office at [phone]  Weeknight (5PM-9AM) or Weekend/Holiday: Check [email or web address] (password " [email or web address]") for General Surgery [facility] coverage  (Please, do not use SecureChat as it is not reliable communication to operating surgeons for immediate patient care)      06/24/2022  7:22 AM

## 2022-06-24 NOTE — Progress Notes (Addendum)
Calorie Count Note   48 hour calorie count ordered. Day 1 below.   Diet: Heart Healthy  Supplements:  -Boost Breeze po TID, each supplement provides 250 kcal and 9 grams of protein  -Prosource Plus PO TID, each provides 100 kcals and 15g protein  -Beneprotein powder TID, each provides 25 kcals and 6g protein  -Unjury chicken Soup 4 oz BID, Each serving provides 50 kcals and 10g protein   7/5-7/6: Breakfast: 80 kcals, 2g protein Lunch: 150 kcals, 1g protein Dinner (7/5): 160 kcals, 9g protein Supplements: no supplements consumed   Total intake: 390 kcal (26% of minimum estimated needs)  12g protein (16% of minimum estimated needs)   Per discussion with pt and pt's daughter, pt does not like any of the supplements other than the Unjury Chicken soup supplements. Will increase to TID. Discussed menu options, pt not very hungry and declined a lot of what was offered. Will add daily snack of cheese and crackers and add vanilla ice cream to trays. Placed order for dinner, pot roast and a cola. Pt did not want any sides or anything with meal.   Nutrition Dx: Increased nutrient needs related to cancer and cancer related treatments as evidenced by estimated needs.    Goal: Pt to meet >/= 90% of their estimated nutrition needs    Intervention:  -Continue Unjury Chicken soup supplements ->increase to TID -Added 1 cup of vanilla ice cream to every meal tray -Daily snack of cheese and crackers -Encouraged PO intakes -Family to bring items from home if desired   Clayton Bibles, MS, RD, LDN Inpatient Clinical Dietitian Contact information available via Amion

## 2022-06-25 ENCOUNTER — Other Ambulatory Visit (HOSPITAL_COMMUNITY): Payer: Self-pay

## 2022-06-25 LAB — POTASSIUM: Potassium: 3.5 mmol/L (ref 3.5–5.1)

## 2022-06-25 LAB — CREATININE, SERUM
Creatinine, Ser: 1.38 mg/dL — ABNORMAL HIGH (ref 0.44–1.00)
GFR, Estimated: 38 mL/min — ABNORMAL LOW (ref >60)

## 2022-06-25 MED ORDER — HYDROCODONE-ACETAMINOPHEN 5-325 MG PO TABS
1.0000 | ORAL_TABLET | Freq: Three times a day (TID) | ORAL | 0 refills | Status: DC | PRN
  Filled 2022-06-25: qty 15, 5d supply, fill #0

## 2022-06-25 MED ORDER — METOCLOPRAMIDE HCL 5 MG PO TABS
5.0000 mg | ORAL_TABLET | Freq: Three times a day (TID) | ORAL | 1 refills | Status: DC | PRN
  Filled 2022-06-25: qty 30, 10d supply, fill #0

## 2022-06-25 NOTE — Progress Notes (Signed)
Discharge instructions discussed with patient and daughter, verbalized agreement and understanding

## 2022-06-25 NOTE — Discharge Summary (Signed)
Physician Discharge Summary    Patient ID: Contrina Orona MRN: [id number] DOB/AGE: April 15, 1935  87 y.o.  Patient Care Team: Hoyt Koch, MD as PCP - General (Internal Medicine) [facility] 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 No obvious metastatic disease on visceral parietal peritoneum or liver.   It is an isoperistaltic ileocolonic anastomosis that rests in the periumbilical region.    Consults: Physical Therapy, Occupational Therapy, Pharmacy, Nutrition, and Case Management / [facility] Course:   The patient underwent the surgery above.  Postoperatively, the patient gradually mobilized and advanced to a solid diet.  Pain and other symptoms were treated aggressively.  Patient did have some issues with nighttime emesis of uncertain etiology.  She felt it was reflux related.  Improved with metoclopramide and reflux medication.  Had bowel function with irregular bowels at first.  Stabilized with help of magnesium oxide and low-dose MiraLAX.  Patient had  elevated creatinine over baseline chronic kidney disease but was never oliguric.  Gently rehydrated and stabilized improved.  Patient's p.o. intake was fair but stable to improving.  Worked with nutrition to help find adequate supplements along with calorie counts.  Patient preferred food from home that her daughter brought.  Patient had some mild grogginess but seem to improve with switching medications around.  Given irregular bowels CAT scan was done which disproved any evidence of obstruction leak or abscess.  Just mild ileus.  She gradually improved.  By the time of discharge, the patient was walking well the hallways, eating food, having controlled bowel movements. Pain was well-controlled on an oral medications.  Based on meeting discharge criteria and continuing to recover, I felt it was safe for the patient to be discharged from the hospital to further recover with close followup.  Patient claims she had support with her daughter and equipment home.  I wrote for home PT safety visit just in case.  Postoperative recommendations were discussed in detail.  They are written as well.  Discharged Condition: good  Discharge Exam: Blood pressure 126/67, pulse 69, temperature (!) 97.4 F (36.3 C), temperature source Oral, resp. rate 18, height '5\' 3"'$  (1.6 m), weight 44.1 kg, SpO2 100 %.  General: Pt awake/alert/oriented x4 in No acute distress Eyes: PERRL, normal EOM.  Sclera clear.  No icterus Neuro: CN II-XII intact w/o focal sensory/motor deficits. Lymph: No head/neck/groin lymphadenopathy Psych:  No delerium/psychosis/paranoia HENT: Normocephalic, Mucus membranes moist.  No thrush Neck: Supple, No tracheal deviation Chest:  No chest wall pain w good excursion CV:  Pulses intact.  Regular rhythm MS: Normal AROM mjr joints.  No  obvious deformity Abdomen: Soft.  Nondistended.  Nontender.  No evidence of peritonitis.  No incarcerated hernias. Ext:  SCDs BLE.  No mjr edema.  No cyanosis Skin: No  petechiae / purpura   Disposition:    Follow-up Information     Michael Boston, MD Follow up.   Specialties: General Surgery, Colon and Rectal Surgery Why: To follow up after your operation, To follow up after your hospital stay Contact information: [place] [address] [phone]                 Discharge disposition: 01-Home or Self Care       Discharge Instructions     Call MD for:   Complete by: As directed    FEVER > 101.5 F  (temperatures < 101.5 F are not significant)   Call MD for:  extreme fatigue   Complete by: As directed    Call MD for:  persistant dizziness or light-headedness   Complete by: As directed    Call MD for:  persistant nausea and vomiting   Complete by: As directed    Call MD for:  redness, tenderness, or signs of infection (pain, swelling, redness, odor or green/yellow discharge around incision site)   Complete by: As directed    Call MD for:  severe uncontrolled pain   Complete by: As directed    Diet - low sodium heart healthy   Complete by: As directed    Start with a bland diet such as soups, liquids, starchy foods, low fat foods, etc. the first few days at home. Gradually advance to a solid, low-fat, high fiber diet by the end of the first week at home.   Add a fiber supplement to your diet (Metamucil, etc) If you feel full, bloated, or constipated, stay on a full liquid or pureed/blenderized diet for a few days until you feel better and are no longer constipated.   Discharge instructions   Complete by: As directed    See Discharge Instructions If you are not getting better after two weeks or are noticing you are getting worse, contact our office [phone] for further advice.  We may need to adjust your medications, re-evaluate you in the office, send you to the emergency room, or see what other things we can do to help. The clinic staff is available to answer your questions during regular  business hours (8:30am-5pm).  Please don't hesitate to call and ask to speak to one of our nurses for clinical concerns.    A surgeon from [facility] is always on call at the hospitals 24 hours/day If you have a medical emergency, go to the nearest emergency room or call 911.   Discharge wound care:   Complete by: As directed    It is good for closed incisions and even open wounds to be washed every day.  Shower every day.  Short baths are fine.  Wash the incisions and wounds clean with soap & water.    You may leave closed incisions open to air if it is dry.   You may cover the incision with clean gauze & replace it after your daily shower for comfort.  TEGADERM:  You have clear gauze band-aid dressings over your closed incision(s).  Remove the dressings 3 days after surgery = July 2nd = Sunday   Driving Restrictions   Complete by: As directed    You may drive when: - you are no longer taking  narcotic prescription pain medication - you can comfortably wear a seatbelt - you can safely make sudden turns/stops without pain.   Increase activity slowly   Complete by: As directed    Start light daily activities --- self-care, walking, climbing stairs- beginning the day after surgery.  Gradually increase activities as tolerated.  Control your pain to be active.  Stop when you are tired.  Ideally, walk several times a day, eventually an hour a day.   Most people are back to most day-to-day activities in a few weeks.  It takes 4-6 weeks to get back to unrestricted, intense activity. If you can walk 30 minutes without difficulty, it is safe to try more intense activity such as jogging, treadmill, bicycling, low-impact aerobics, swimming, etc. Save the most intensive and strenuous activity for last (Usually 4-8 weeks after surgery) such as sit-ups, heavy lifting, contact sports, etc.  Refrain from any intense heavy lifting or straining until you are off narcotics for pain control.  You will  have off days, but things should improve week-by-week. DO NOT PUSH THROUGH PAIN.  Let pain be your guide: If it hurts to do something, don't do it.   Lifting restrictions   Complete by: As directed    If you can walk 30 minutes without difficulty, it is safe to try more intense activity such as jogging, treadmill, bicycling, low-impact aerobics, swimming, etc. Save the most intensive and strenuous activity for last (Usually 4-8 weeks after surgery) such as sit-ups, heavy lifting, contact sports, etc.   Refrain from any intense heavy lifting or straining until you are off narcotics for pain control.  You will have off days, but things should improve week-by-week. DO NOT PUSH THROUGH PAIN.  Let pain be your guide: If it hurts to do something, don't do it.  Pain is your body warning you to avoid that activity for another week until the pain goes down.   May shower / Bathe   Complete by: As directed    May walk up steps   Complete by: As directed    Remove dressing in 72 hours   Complete by: As directed    Make sure all dressings are removed by the third day after surgery.  Leave incisions open to air.  OK to cover incisions with gauze or bandages as desired   Sexual Activity Restrictions   Complete by: As directed    You may have sexual intercourse when it is comfortable. If it hurts to do something, stop.       Allergies as of 06/25/2022   No Known Allergies      Medication List     TAKE these medications    B COMPLEX 1 PO Take 1 tablet by mouth daily.   calcium carbonate 1500 (600 Ca) MG Tabs tablet Commonly known as: OSCAL Take 600 mg of elemental calcium by mouth daily with breakfast.   ferrous sulfate 325 (65 FE) MG tablet Take 325 mg by mouth daily with breakfast.   HAIR/SKIN/NAILS PO Take 1 tablet by mouth daily.   HYDROcodone-acetaminophen 5-325 MG tablet Commonly known as: NORCO/VICODIN Take 1 tablet by mouth every 8 (eight) hours as needed for moderate pain or  severe pain.   MAGNESIUM PO Take 1 tablet by mouth daily.   metoCLOPramide 5 MG tablet Commonly known as: REGLAN Take 1 tablet (5 mg total) by mouth every 8 (eight) hours as needed for nausea or vomiting.  Discharge Care Instructions  (From admission, onward)           Start     Ordered   06/17/22 0000  Discharge wound care:       Comments: It is good for closed incisions and even open wounds to be washed every day.  Shower every day.  Short baths are fine.  Wash the incisions and wounds clean with soap & water.    You may leave closed incisions open to air if it is dry.   You may cover the incision with clean gauze & replace it after your daily shower for comfort.  TEGADERM:  You have clear gauze band-aid dressings over your closed incision(s).  Remove the dressings 3 days after surgery = July 2nd = Sunday   06/17/22 1350            Significant Diagnostic Studies:  Results for orders placed or performed during the hospital encounter of 06/17/22 (from the past 72 hour(s))  Potassium     Status: None   Collection Time: 06/23/22  4:47 AM  Result Value Ref Range   Potassium 3.6 3.5 - 5.1 mmol/L    Comment: Performed at [facility], [place] [address] [place], [place] [address]  Creatinine, serum     Status: Abnormal   Collection Time: 06/23/22  4:47 AM  Result Value Ref Range   Creatinine, Ser 1.21 (H) 0.44 - 1.00 mg/dL   GFR, Estimated 44 (L) >60 mL/min    Comment: (NOTE) Calculated using the CKD-EPI Creatinine Equation (2021) Performed at [facility], [place] [address] [place], [place] [address]   Creatinine, serum     Status: Abnormal   Collection Time: 06/24/22  4:57 AM  Result Value Ref Range   Creatinine, Ser 1.42 (H) 0.44 - 1.00 mg/dL   GFR, Estimated 36 (L) >60 mL/min    Comment: (NOTE) Calculated using the CKD-EPI Creatinine Equation (2021) Performed at [facility], [place] [address] [place], [place] [address]   Potassium     Status: None   Collection Time: 06/24/22  4:57 AM  Result Value Ref Range   Potassium 3.8 3.5 - 5.1 mmol/L    Comment: Performed at [facility], [place] [address], [place], [place] [address]  Lipase, blood     Status: Abnormal   Collection Time: 06/24/22  4:57 AM  Result Value Ref Range   Lipase 52 (H) 11 - 51 U/L    Comment: Performed at [facility], [place] [address], [place], [place] [address]  Potassium     Status: None   Collection Time: 06/25/22  5:32 AM  Result Value Ref Range   Potassium 3.5 3.5 - 5.1 mmol/L    Comment: Performed at [facility], [place] [address] [place], [place] [address]  Creatinine, serum     Status: Abnormal   Collection Time: 06/25/22  5:32 AM  Result Value Ref Range   Creatinine, Ser 1.38 (H) 0.44 - 1.00 mg/dL   GFR, Estimated 38 (L) >60 mL/min    Comment: (NOTE) Calculated using the CKD-EPI Creatinine Equation (2021) Performed at [facility], [place] [address], [place], [place] [address]     No results found.  Past Medical History:  Diagnosis Date   Anemia    Cancer ([place])    colon   Chicken pox     Past Surgical History:  Procedure Laterality Date   EYE SURGERY  macular hole repair bil cataract bil    Social History   Socioeconomic History   Marital status: Widowed    Spouse name: Not on file   Number of children: Not on file   Years of education: Not on file   Highest education level: Not on file  Occupational History   Not on file  Tobacco Use   Smoking status: Never   Smokeless tobacco: Never  Vaping Use   Vaping Use: Never used  Substance and Sexual Activity   Alcohol use: Not Currently    Alcohol/week: 1.0 standard drink of alcohol    Types: 1 Glasses of wine per week    Comment: social   Drug use: Never   Sexual activity: Not Currently    Partners: Male  Other Topics Concern   Not on file  Social  History Narrative   [place] ancestry   Originally from [place] Strain: Low Risk  (04/16/2022)   Overall Financial Resource Strain (CARDIA)    Difficulty of Paying Living Expenses: Not hard at all  Food Insecurity: No Food Insecurity (04/16/2022)   Hunger Vital Sign    Worried About Running Out of Food in the Last Year: Never true    [place] in the Last Year: Never true  Transportation Needs: No Transportation Needs (04/16/2022)   PRAPARE - Hydrologist (Medical): No    Lack of Transportation (Non-Medical): No  Physical Activity: Insufficiently Active (04/16/2022)   Exercise Vital Sign    Days of Exercise per Week: 3 days    Minutes of Exercise per Session: 30 min  Stress: No Stress Concern Present (04/16/2022)   [place]    Feeling of Stress : Not at all  Social Connections: Moderately Integrated (04/16/2022)   Social Connection and Isolation Panel [NHANES]    Frequency of Communication with Friends and Family: Twice a week    Frequency of Social Gatherings with Friends and Family: Twice a week    Attends Religious Services: More than 4 times per year    Active Member of [employer] or Organizations: Yes    Attends Archivist Meetings: 1 to 4 times per year    Marital Status: Widowed  Intimate Partner Violence: Not At Risk (04/16/2022)   Humiliation, Afraid, Rape, and Kick questionnaire    Fear of Current or Ex-Partner: No    Emotionally Abused: No    Physically Abused: No    Sexually Abused: No    Family History  Problem Relation Age of Onset   Cancer Mother    Diabetes Mother    Early death Mother    Arthritis Father    Cancer Sister    Diabetes Sister    Stroke Sister    Heart disease Brother    Heart attack Brother    Colon cancer Neg Hx    Rectal cancer Neg Hx    Stomach cancer Neg Hx     Current  Facility-Administered Medications  Medication Dose Route Frequency Provider Last Rate Last Admin   0.9 %  sodium chloride infusion  250 mL Intravenous PRN Michael Boston, MD       0.9 %  sodium chloride infusion  250 mL Intravenous PRN Michael Boston, MD       acetaminophen (TYLENOL) tablet 500 mg  500 mg Oral Lajuana Ripple, MD  alum & mag hydroxide-simeth (MAALOX/MYLANTA) 200-200-20 MG/5ML suspension 30 mL  30 mL Oral Q6H PRN Michael Boston, MD       bismuth subsalicylate (PEPTO BISMOL) 262 MG/15ML suspension 30 mL  30 mL Oral Q8H PRN Michael Boston, MD       calcium carbonate (OS-CAL - dosed in mg of elemental calcium) tablet 1,250 mg  500 mg of elemental calcium Oral Q breakfast Michael Boston, MD   1,250 mg at 06/24/22 0815   diphenhydrAMINE (BENADRYL) 12.5 MG/5ML elixir 12.5 mg  12.5 mg Oral Q6H PRN Michael Boston, MD       Or   diphenhydrAMINE (BENADRYL) injection 12.5 mg  12.5 mg Intravenous Q6H PRN Michael Boston, MD       enalaprilat (VASOTEC) injection 0.625-1.25 mg  0.625-1.25 mg Intravenous Q6H PRN Michael Boston, MD       famotidine (PEPCID) tablet 20 mg  20 mg Oral Ardeen Fillers, MD   20 mg at 06/24/22 2049   fentaNYL (SUBLIMAZE) injection 12.5-25 mcg  12.5-25 mcg Intravenous Q4H PRN Michael Boston, MD       hydrALAZINE (APRESOLINE) injection 10 mg  10 mg Intravenous Q2H PRN Michael Boston, MD       HYDROcodone-acetaminophen (NORCO/VICODIN) 5-325 MG per tablet 1 tablet  1 tablet Oral Q4H PRN Michael Boston, MD       lactated ringers bolus 1,000 mL  1,000 mL Intravenous Q8H PRN Michael Boston, MD       lip balm (CARMEX) ointment   Topical BID Michael Boston, MD   1 Application at 37/90/24 2051   magic mouthwash  15 mL Oral QID PRN Michael Boston, MD       magnesium oxide (MAG-OX) tablet 200 mg  200 mg Oral Daily Michael Boston, MD   200 mg at 06/24/22 1200   melatonin tablet 3 mg  3 mg Oral QHS PRN Michael Boston, MD       metoCLOPramide (REGLAN) tablet 5 mg  5 mg Oral TID AC & HS  Michael Boston, MD   5 mg at 06/24/22 2049   metoprolol tartrate (LOPRESSOR) injection 5 mg  5 mg Intravenous Q6H PRN Michael Boston, MD       ondansetron [facility]) tablet 4 mg  4 mg Oral Q6H PRN Michael Boston, MD       Or   ondansetron [facility]) injection 4 mg  4 mg Intravenous Q6H PRN Michael Boston, MD   4 mg at 06/21/22 0219   pantoprazole (PROTONIX) EC tablet 40 mg  40 mg Oral Daily Michael Boston, MD   40 mg at 06/24/22 0815   phosphorus (K PHOS NEUTRAL) tablet 250 mg  250 mg Oral BID Michael Boston, MD   250 mg at 06/24/22 2050   polyethylene glycol (MIRALAX / GLYCOLAX) packet 17 g  17 g Oral Daily Michael Boston, MD   17 g at 06/24/22 1200   prochlorperazine (COMPAZINE) tablet 10 mg  10 mg Oral Q6H PRN Michael Boston, MD       Or   prochlorperazine (COMPAZINE) injection 5-10 mg  5-10 mg Intravenous Q6H PRN Michael Boston, MD   10 mg at 06/21/22 0604   protein supplement (UNJURY CHICKEN SOUP) powder 4 oz  4 oz Oral TID Michael Boston, MD       simethicone [facility]) chewable tablet 40 mg  40 mg Oral Q6H PRN Michael Boston, MD       sodium chloride flush (NS) 0.9 % injection 3 mL  3 mL Intravenous Q12H  Michael Boston, MD   3 mL at 06/23/22 2211   sodium chloride flush (NS) 0.9 % injection 3 mL  3 mL Intravenous PRN Michael Boston, MD       sodium chloride flush (NS) 0.9 % injection 3 mL  3 mL Intravenous Gorden Harms, MD       sodium chloride flush (NS) 0.9 % injection 3 mL  3 mL Intravenous PRN Michael Boston, MD         No Known Allergies  Signed:   Adin Hector, MD, FACS, MASCRS Esophageal, Gastrointestinal & Colorectal Surgery Robotic and Minimally Invasive Surgery  [facility] [address] [place], [place] [address] [phone] Fax [phone] Main  CONTACT INFORMATION:  Weekday (9AM-5PM): Call [facility] main office at [phone]  Weeknight (5PM-9AM) or Weekend/Holiday: Check [email or web address] (password " [email or web address]") for  General Surgery [facility] coverage  (Please, do not use SecureChat as it is not reliable communication to operating surgeons for immediate patient care)      06/25/2022, 7:39 AM

## 2022-06-30 ENCOUNTER — Other Ambulatory Visit: Payer: Self-pay

## 2022-06-30 LAB — SURGICAL PATHOLOGY

## 2022-06-30 NOTE — Progress Notes (Signed)
The proposed treatment discussed in conference is for discussion purpose only and is not a binding recommendation.  The patients have not been physically examined, or presented with their treatment options.  Therefore, final treatment plans cannot be decided.  

## 2022-07-22 ENCOUNTER — Telehealth: Payer: Self-pay | Admitting: Gastroenterology

## 2022-07-22 NOTE — Telephone Encounter (Signed)
Ann [name],  I reviewed this patient's final surgical pathology after her surgery for colon cancer.  Please place a recall for colonoscopy in June 2024.  (There was a typo in my original result note from the 05/18/2022 colonoscopy biopsies indicating that the recall should be in June of 2020).  HD

## 2022-07-27 IMAGING — CT CT ABD-PELV W/O CM
2 of 4 series · 12 of 46 positions shown, 14 images · non-contrast
Comparison: None Available.

CLINICAL DATA: Right lower quadrant abdominal pain.



[Series 2: routine abdomen pelvis without 5.00 br40 s3 axial · axial · non-contrast · 0.49mm/px · z∈[+1054,+1404]mm · 9 of 85 slices shown, 11 images]
[im 8/85  soft-tissue]
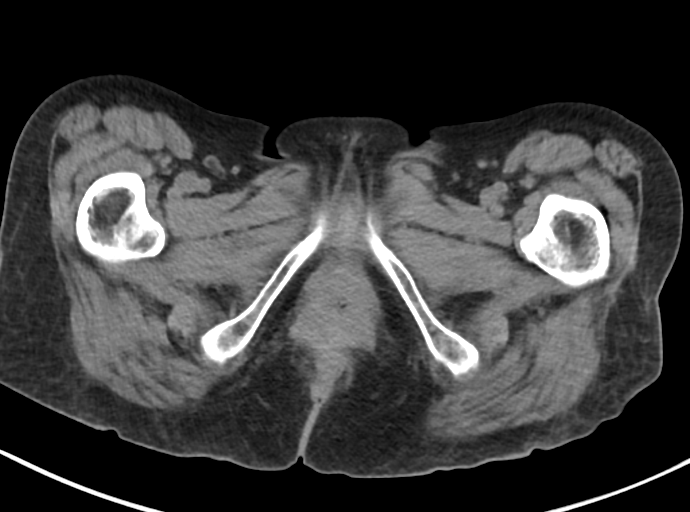
[im 8/85  bone]
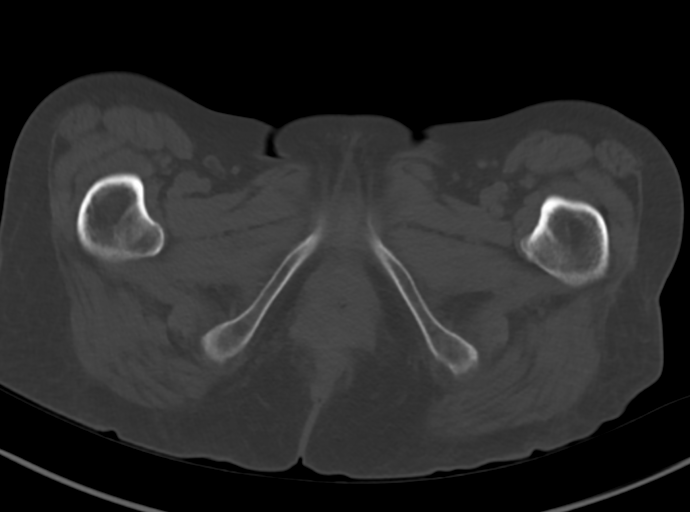
[im 15/85  soft-tissue]
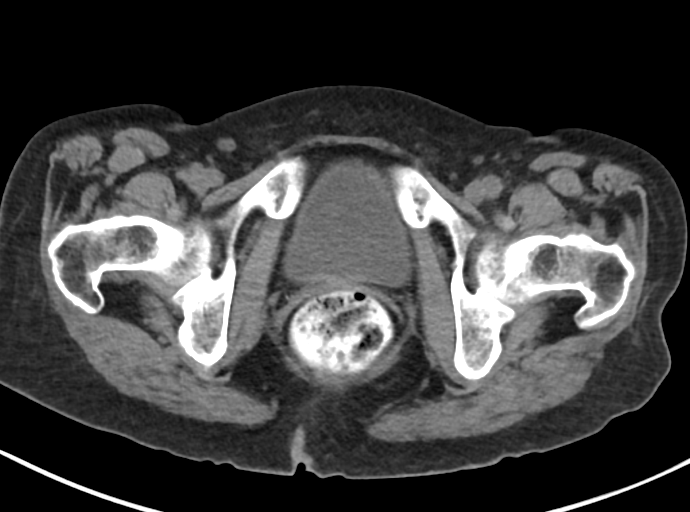
[im 25/85  soft-tissue]
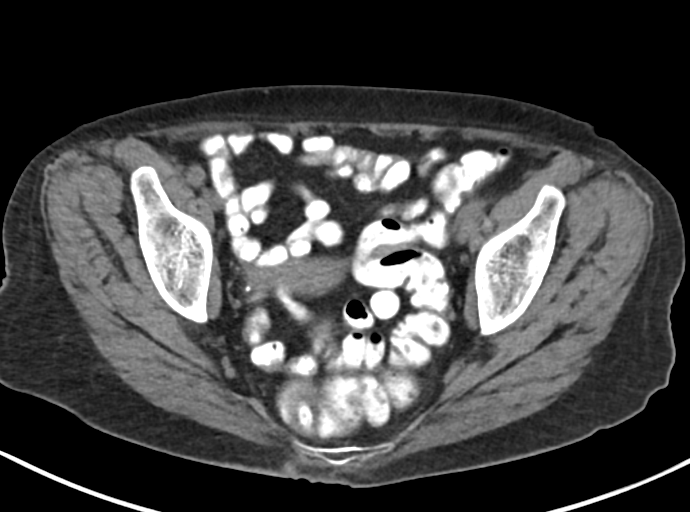
[im 32/85  soft-tissue]
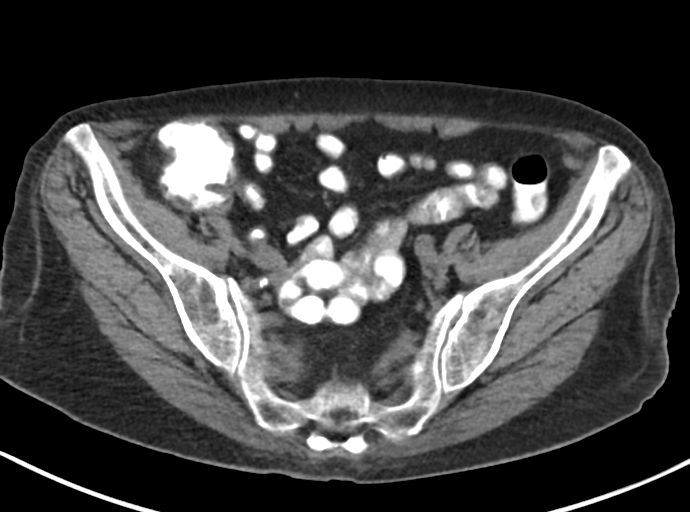
[im 43/85  soft-tissue]
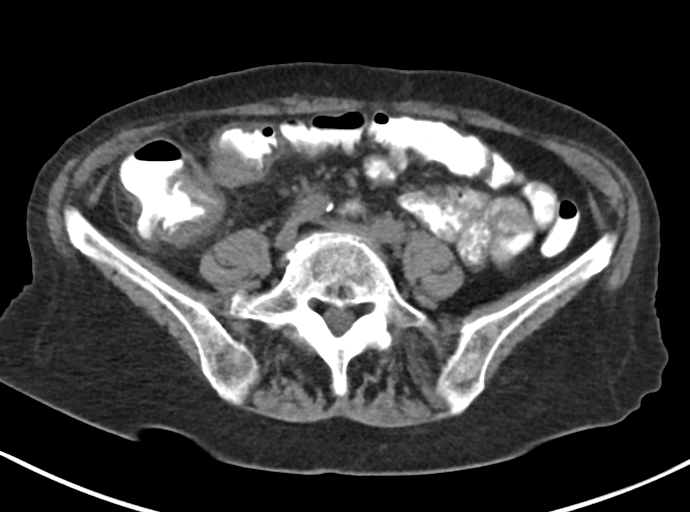
[im 53/85  soft-tissue]
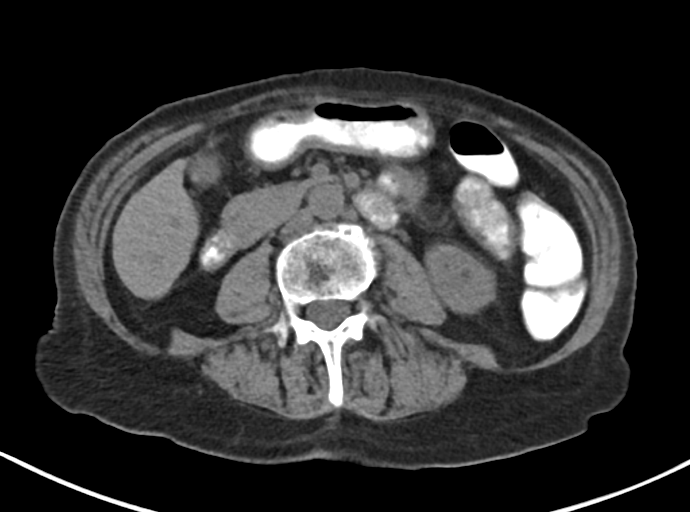
[im 60/85  soft-tissue]
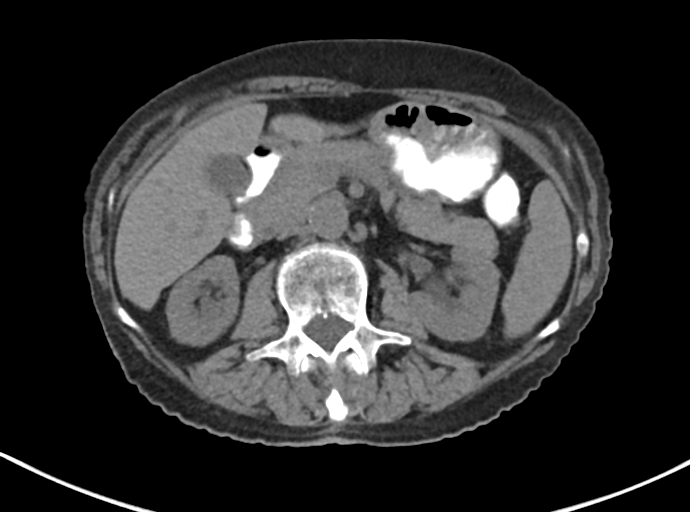
[im 71/85  soft-tissue]
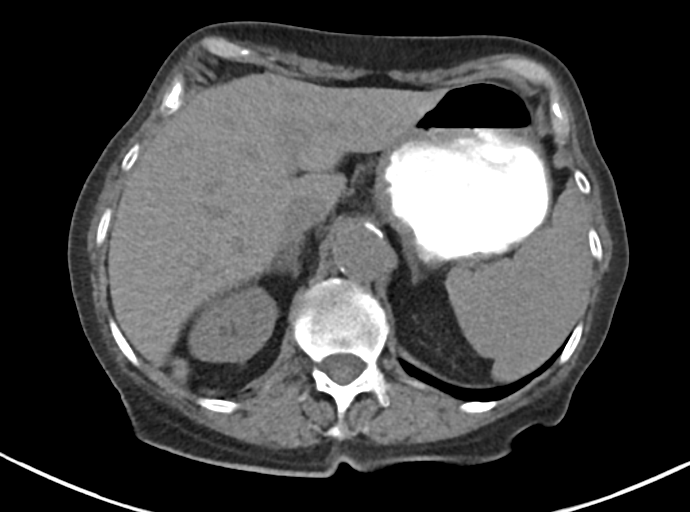
[im 78/85  soft-tissue]
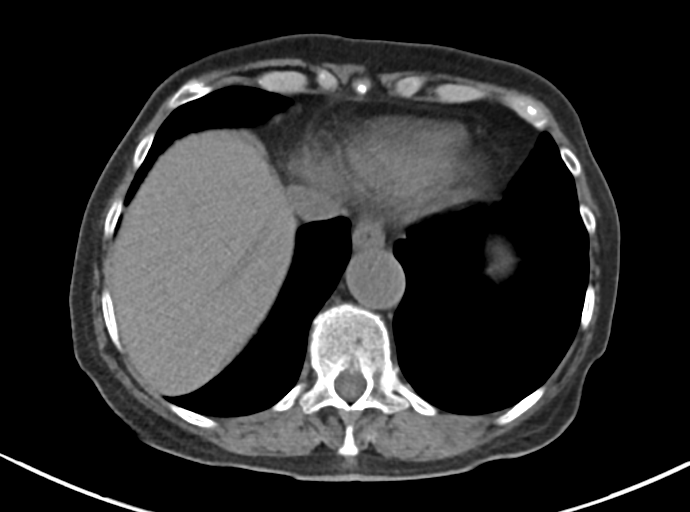
[im 78/85  bone]
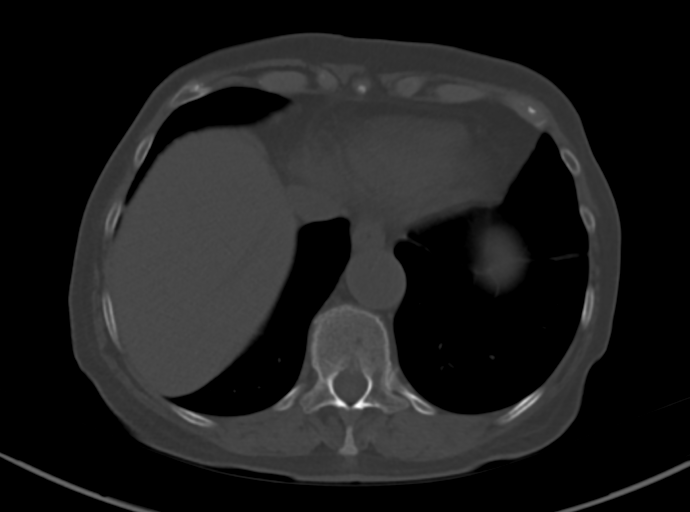

[Series 4: routine abdomen pelvis without 2.00 br40 s3 cor · coronal · non-contrast · 0.65mm/px · 3 of 124 slices shown]
[im 42/124  soft-tissue]
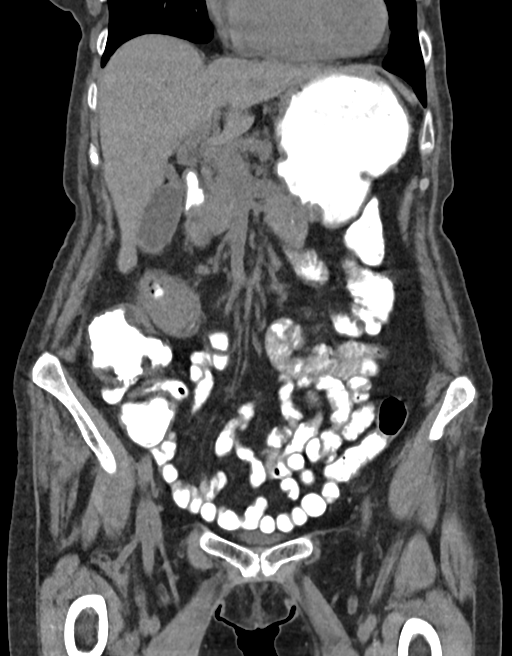
[im 55/124  soft-tissue]
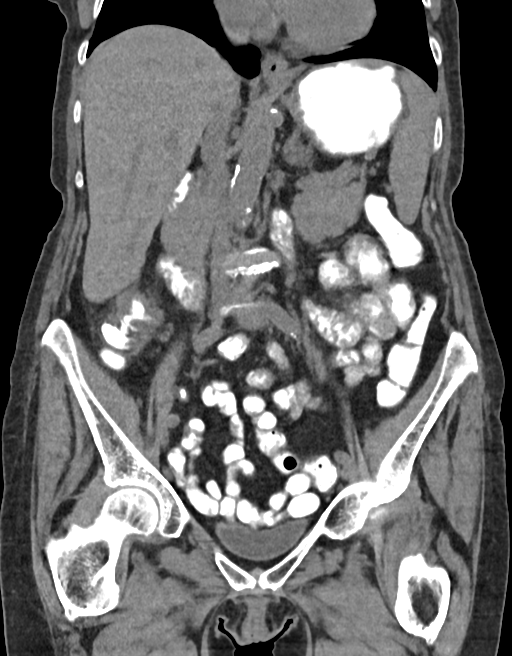
[im 69/124  soft-tissue]
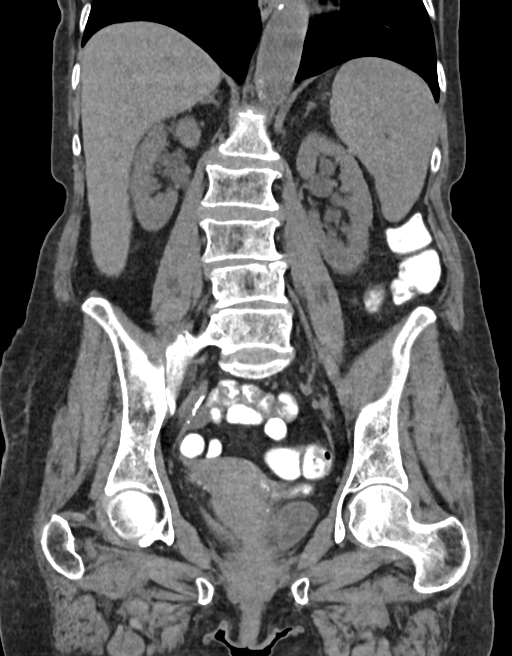

[12 of 46 positions shown; findings below may reference images not displayed]

FINDINGS: Evaluation of this exam is limited in the absence of intravenous
contrast.

Lower chest: Left lung base linear atelectasis/scarring. The
visualized lung bases are otherwise clear.

No intra-abdominal free air or free fluid.

Hepatobiliary: The liver is unremarkable. No intrahepatic biliary
ductal dilatation. Probable small gallstones versus less likely foci
of calcification in the gallbladder wall. No pericholecystic fluid
or evidence of acute cholecystitis by CT.

Pancreas: Unremarkable. No pancreatic ductal dilatation or
surrounding inflammatory changes.

Spleen: Normal in size without focal abnormality.

Adrenals/Urinary Tract: The adrenal glands are unremarkable. Mild
bilateral hydronephrosis versus small parapelvic cyst. No calcified
stone identified. The visualized ureters and the urinary bladder
appear unremarkable.

Stomach/Bowel: There is wall thickening of the hepatic flexure of
the colon with adjacent stranding. There is associated luminal
narrowing. There is apparent shouldering of the inflamed and
thickened segment. Although this may represent colitis, findings
concerning for a neoplastic process. Further evaluation with
colonoscopy after resolution of inflammatory changes recommended.
There is no bowel obstruction. The appendix is normal.

Vascular/Lymphatic: Mild aortoiliac atherosclerotic disease. The IVC
is grossly unremarkable. No portal venous gas. There is no
adenopathy.

Reproductive: The uterus and ovaries are grossly unremarkable. No
adnexal masses.

Other: None

Musculoskeletal: Osteopenia with degenerative changes of spine. No
acute osseous pathology.
IMPRESSION: 1. Findings highly concerning for malignancy of the hepatic flexure
of the colon with possible superimposed colitis. Correlation with
clinical exam and follow-up with colonoscopy after resolution of
acute inflammation recommended.
2. No bowel obstruction. Normal appendix.
3. Probable small gallstones versus less likely foci of
calcification in the gallbladder wall. No evidence of acute
cholecystitis by CT.
4. Mild bilateral hydronephrosis versus small parapelvic cyst.
5. Aortic Atherosclerosis ([id number]-SGC.C).

## 2022-07-28 ENCOUNTER — Encounter: Payer: Self-pay | Admitting: *Deleted

## 2022-07-28 NOTE — Progress Notes (Signed)
PATIENT NAVIGATOR PROGRESS NOTE  Name: Ann [name] Date: 07/28/2022 MRN: [id number]  DOB: [birth year]   Reason for visit:  Introductory phone call  Comments:  Called and spoke with pt regarding referral from Dr Johney Maine, she is scheduled to see Dr Benay Spice on 8/15 in New Patient slot.Discussed directions and parking directions.  One support person allowed in appt. Given contact information to call with questions    Time spent counseling/coordinating care: 30-45 minutes

## 2022-08-03 ENCOUNTER — Encounter: Payer: Self-pay | Admitting: Oncology

## 2022-08-03 ENCOUNTER — Inpatient Hospital Stay: Payer: Medicare Other | Attending: Oncology | Admitting: Oncology

## 2022-08-03 ENCOUNTER — Inpatient Hospital Stay: Payer: Medicare Other

## 2022-08-03 ENCOUNTER — Telehealth: Payer: Self-pay

## 2022-08-03 VITALS — BP 140/70 | HR 100 | Temp 98.1°F | Resp 20 | Ht 63.0 in | Wt 107.4 lb

## 2022-08-03 DIAGNOSIS — Z808 Family history of malignant neoplasm of other organs or systems: Secondary | ICD-10-CM | POA: Diagnosis not present

## 2022-08-03 DIAGNOSIS — Z86010 Personal history of colonic polyps: Secondary | ICD-10-CM | POA: Insufficient documentation

## 2022-08-03 DIAGNOSIS — C183 Malignant neoplasm of hepatic flexure: Secondary | ICD-10-CM | POA: Insufficient documentation

## 2022-08-03 DIAGNOSIS — D509 Iron deficiency anemia, unspecified: Secondary | ICD-10-CM | POA: Insufficient documentation

## 2022-08-03 LAB — CBC WITH DIFFERENTIAL (CANCER CENTER ONLY)
Abs Immature Granulocytes: 0.02 10*3/uL (ref 0.00–0.07)
Basophils Absolute: 0.1 10*3/uL (ref 0.0–0.1)
Basophils Relative: 1 %
Eosinophils Absolute: 0.1 10*3/uL (ref 0.0–0.5)
Eosinophils Relative: 2 %
HCT: 35.3 % — ABNORMAL LOW (ref 36.0–46.0)
Hemoglobin: 11.4 g/dL — ABNORMAL LOW (ref 12.0–15.0)
Immature Granulocytes: 0 %
Lymphocytes Relative: 16 %
Lymphs Abs: 1 10*3/uL (ref 0.7–4.0)
MCH: 28.1 pg (ref 26.0–34.0)
MCHC: 32.3 g/dL (ref 30.0–36.0)
MCV: 87.2 fL (ref 80.0–100.0)
Monocytes Absolute: 0.5 10*3/uL (ref 0.1–1.0)
Monocytes Relative: 8 %
Neutro Abs: 4.7 10*3/uL (ref 1.7–7.7)
Neutrophils Relative %: 73 %
Platelet Count: 242 10*3/uL (ref 150–400)
RBC: 4.05 MIL/uL (ref 3.87–5.11)
RDW: 17 % — ABNORMAL HIGH (ref 11.5–15.5)
WBC Count: 6.4 10*3/uL (ref 4.0–10.5)
nRBC: 0 % (ref 0.0–0.2)

## 2022-08-03 LAB — CEA (ACCESS): CEA (CHCC): 1.93 ng/mL (ref 0.00–5.00)

## 2022-08-03 NOTE — Telephone Encounter (Signed)
-----   Message from Ladell Pier, MD sent at 08/03/2022  4:50 PM EDT ----- Please call patient, CEA is now normal, follow-up as scheduled

## 2022-08-03 NOTE — Progress Notes (Addendum)
Ann Ann [name]   Requesting MD: Michael Boston, Md [address] [place],  [place] [address]   Ann Ann [name] 87 y.o.  Jul 04, 1935    Reason for Ann [name]: Colon cancer   HPI: Ms. reports losing weight for the past year.  She noted palpable "feces "in the right abdomen and saw Dr. Sharlet Salina.  A CT of the abdomen and pelvis 04/20/2022 feels wall thickening of the hepatic flexure adjacent stranding.  There was associated luminal narrowing.  No obstruction.  No adenopathy.  She underwent a colonoscopy by Dr. Loletha Carrow on 05/18/2022.  A partially obstructing mass was found in the transverse colon.  The mass could not be passed by the pediatric colonoscope.  The mass was biopsied and the area was tattooed.  3 polyps in the transverse colon were not removed.  Tattoos were placed distal to the polyps.  A polyp was removed from the rectum. The pathology from the transverse colon mass returned as poorly differentiated adenocarcinoma.  The rectum polyp was a tubular adenoma.  She was noted to be anemic prior to surgery.  She was referred to Dr. Johney Maine and was taken to the operating room on 06/17/2022 for a robotic right colectomy.  A "bulky "tumor was noted at the hepatic flexure.  No evidence of metastatic disease on the peritoneum or liver.  She was discharged home 06/25/2022.  She reports feeling well.  She is gaining weight.  Her bowels are functioning.   Past Medical History:  Diagnosis Date   Anemia-microcytic April 2023   Cancer (HCC)-hepatic flexure, T3N0 06/17/2022   colon   Chicken pox     .  G2, P2  Past Surgical History:  Procedure Laterality Date   EYE SURGERY     macular hole repair bil cataract bil    Medications: Reviewed  Allergies: No Known Allergies  Family history: Her mother died of "bone cancer "in her 13s.  A sister died of uterine cancer metastatic to the brain in her 36s.  Another sister had melanoma.    Social History:   She  lives alone in [place].  She is retired working as a Pharmacist, hospital and with an IT consultant.  No cigarette use.  She reports social wine use.  No transfusion history.  No risk factor for HIV or hepatitis.  She has received COVID-19 vaccines.   ROS:   Positives include: Right abdominal fullness prior to colon surgery, 25 pound weight loss prior to surgery-now gaining weight  A complete ROS was otherwise negative.  Physical Exam:  Blood pressure (!) 140/70, pulse 100, temperature 98.1 F (36.7 C), temperature source Oral, resp. rate 20, height 5' 3"  (1.6 m), weight 107 lb 6.4 oz (48.7 kg), SpO2 100 %.  HEENT: Oral cavity without visible mass, neck without mass Lungs: Clear bilaterally Cardiac: Regular rate and rhythm Abdomen: No hepatosplenomegaly, healed surgical incisions, no mass  Vascular: No leg edema Lymph nodes: No cervical, supraclavicular, axillary, or inguinal nodes Neurologic: Alert and oriented, the motor exam appears intact in the upper and lower extremities bilaterally Skin: No rash Musculoskeletal: No spine tenderness   LAB:  CBC  Lab Results  Component Value Date   WBC 6.4 08/03/2022   HGB 11.4 (L) 08/03/2022   HCT 35.3 (L) 08/03/2022   MCV 87.2 08/03/2022   PLT 242 08/03/2022   NEUTROABS 4.7 08/03/2022        CMP  Lab Results  Component Value Date   NA 135 06/18/2022  K 3.5 06/25/2022   CL 104 06/18/2022   CO2 23 06/18/2022   GLUCOSE 96 06/18/2022   BUN 16 06/18/2022   CREATININE 1.38 (H) 06/25/2022   CALCIUM 7.8 (L) 06/18/2022   PROT 6.8 04/15/2022   ALBUMIN 3.7 04/15/2022   AST 14 04/15/2022   ALT 9 04/15/2022   ALKPHOS 72 04/15/2022   BILITOT 0.4 04/15/2022   GFRNONAA 38 (L) 06/25/2022   CEA on 523 23-6 0.3  Imaging: CT images from 04/20/2022-reviewed    Assessment/Plan:   Colon cancer-hepatic flexure, stage II (T3N0), status post a robotic right colectomy 06/17/2022 Poorly differentiated adenocarcinoma, grade 3, tumor invades  into pericolonic tissue, no lymphovascular perineural invasion, negative margins, 0/22 nodes, no tumor deposits, 2 tubular adenomas with high-grade dysplasia MSI-high, loss of MLH1 and PMS2, MLH1 hypermethylation present CT abdomen/pelvis 04/20/2022-wall thickening of the hepatic flexure with adjacent stranding and luminal narrowing Colonoscopy 05/18/2022-partially obstructing mass at the transverse colon, 3 polyps in the transverse colon, one rectal polyp-transverse colon polyps not removed Mildly elevated preoperative CEA Microcytic anemia secondary to #1-improved 08/03/2022 Colon polyps-tubular adenoma removed from the rectum 05/18/2022, 2 tubular adenomas with high-grade dysplasia on the colon resection specimen 06/17/2022   Disposition:   Ann Ann [name] has been diagnosed with adenocarcinoma of the hepatic flexure.  She was diagnosed with a stage II tumor.  There is no clinical evidence of metastatic disease.  I reviewed details of the surgery pathology report, prognosis, and adjuvant treatment options with her.  The tumor does not have high risk features aside from the grade 3 histology.  The tumor returned MSI high.  I do not recommend adjuvant chemotherapy in her case.  She has a good prognosis for a long-term disease-free survival.  I discussed the lack of clear benefit from 5-fluorouracil based chemotherapy in patients with MSI high tumors.  There is loss of MLH1 and PMS2 expression suggesting a sporadic tumor.  We will request MLH1 methylation testing to confirm she does not have hereditary nonpolyposis colon cancer syndrome.  Her family members are increased risk of developing colorectal cancer and should receive appropriate screening.  She will return to the lab for a CBC and CEA today.  She will be referred for restaging chest CT.  I do not recommend surveillance imaging beyond the CT today.  She will return for an office visit and CEA in 6 months.  She had multiple polyps on the  colonoscopy and resection specimen.  She should follow-up with Dr. Loletha Carrow to consider the indication for colonoscopy surveillance.  Betsy Coder, MD  08/03/2022, 4:08 PM

## 2022-08-03 NOTE — Telephone Encounter (Signed)
Pt verbalized understanding.

## 2022-08-11 ENCOUNTER — Encounter (HOSPITAL_COMMUNITY): Payer: Self-pay | Admitting: Oncology

## 2022-08-11 ENCOUNTER — Ambulatory Visit (HOSPITAL_BASED_OUTPATIENT_CLINIC_OR_DEPARTMENT_OTHER)
Admission: RE | Admit: 2022-08-11 | Discharge: 2022-08-11 | Disposition: A | Discharge status: Home / Self Care | Payer: Medicare Other | Source: Ambulatory Visit | Attending: Oncology | Admitting: Oncology

## 2022-08-11 DIAGNOSIS — C183 Malignant neoplasm of hepatic flexure: Secondary | ICD-10-CM | POA: Diagnosis present

## 2022-08-13 ENCOUNTER — Other Ambulatory Visit: Payer: Self-pay | Admitting: Oncology

## 2022-08-13 ENCOUNTER — Telehealth: Payer: Self-pay

## 2022-08-13 NOTE — Telephone Encounter (Signed)
Pt verbalized understanding.

## 2022-08-13 NOTE — Telephone Encounter (Signed)
-----   Message from Ladell Pier, MD sent at 08/12/2022  9:53 PM EDT ----- Please call patient, chest CT shows no evidence of cancer, f/u as scheduled

## 2022-09-02 ENCOUNTER — Telehealth: Payer: Self-pay | Admitting: *Deleted

## 2022-09-02 NOTE — Telephone Encounter (Signed)
MLH1 Hypermethylation is present per further testing and per Dr. Benay Spice this shows her tumor is not part of a hereditary syndrome. Patient notifed

## 2022-10-08 ENCOUNTER — Ambulatory Visit
Admission: RE | Admit: 2022-10-08 | Discharge: 2022-10-08 | Disposition: A | Discharge status: Home / Self Care | Payer: Medicare Other | Source: Ambulatory Visit | Attending: Internal Medicine | Admitting: Internal Medicine

## 2022-10-08 DIAGNOSIS — Z780 Asymptomatic menopausal state: Secondary | ICD-10-CM

## 2022-10-14 LAB — HM DEXA SCAN

## 2022-10-27 ENCOUNTER — Ambulatory Visit (INDEPENDENT_AMBULATORY_CARE_PROVIDER_SITE_OTHER): Payer: Medicare Other | Admitting: Internal Medicine

## 2022-10-27 VITALS — BP 160/82 | HR 71 | Temp 98.2°F | Ht 63.0 in | Wt 117.0 lb

## 2022-10-27 DIAGNOSIS — M810 Age-related osteoporosis without current pathological fracture: Secondary | ICD-10-CM | POA: Diagnosis not present

## 2022-10-27 MED ORDER — ALENDRONATE SODIUM 70 MG PO TABS: 70.0000 mg | ORAL_TABLET | ORAL | 3 refills | Status: DC

## 2022-10-27 NOTE — Patient Instructions (Signed)
We have sent in the fosamax (alendronate) to take 1 pill a week

## 2022-10-27 NOTE — Progress Notes (Signed)
   Subjective:   Patient ID: Ann [name], female    DOB: Sep 10, 1935, 87 y.o.   MRN: [id number]  HPI The patient is an 87 YO female coming in for discussion of new osteoporosis.   Review of Systems  Constitutional: Negative.   HENT: Negative.    Eyes: Negative.   Respiratory:  Negative for cough, chest tightness and shortness of breath.   Cardiovascular:  Negative for chest pain, palpitations and leg swelling.  Gastrointestinal:  Negative for abdominal distention, abdominal pain, constipation, diarrhea, nausea and vomiting.  Musculoskeletal: Negative.   Skin: Negative.   Neurological: Negative.   Psychiatric/Behavioral: Negative.      Objective:  Physical Exam Constitutional:      Appearance: She is well-developed.  HENT:     Head: Normocephalic and atraumatic.  Cardiovascular:     Rate and Rhythm: Normal rate and regular rhythm.  Pulmonary:     Effort: Pulmonary effort is normal. No respiratory distress.     Breath sounds: Normal breath sounds. No wheezing or rales.  Abdominal:     General: Bowel sounds are normal. There is no distension.     Palpations: Abdomen is soft.     Tenderness: There is no abdominal tenderness. There is no rebound.  Musculoskeletal:     Cervical back: Normal range of motion.  Skin:    General: Skin is warm and dry.  Neurological:     Mental Status: She is alert and oriented to person, place, and time.     Coordination: Coordination normal.     Vitals:   10/27/22 0948 10/27/22 0951  BP: (!) 180/82 (!) 160/82  Pulse: 71   Temp: 98.2 F (36.8 C)   TempSrc: Oral   SpO2: 99%   Weight: 117 lb (53.1 kg)   Height: '5\' 3"'$  (1.6 m)     Assessment & Plan:

## 2022-10-28 ENCOUNTER — Telehealth: Payer: Self-pay | Admitting: Internal Medicine

## 2022-10-28 NOTE — Telephone Encounter (Signed)
Patient called and said Dr Sharlet Salina sent in alendronate (FOSAMAX) 70 MG tablet  yesterday to [facility] yesterday and she said they told her they dont have and she wasn't sure if she should wait until they have it or get in filled somewhere else. They didn't say when they would get it in. Call back is [phone]

## 2022-10-29 ENCOUNTER — Encounter: Payer: Self-pay | Admitting: Internal Medicine

## 2022-10-29 ENCOUNTER — Other Ambulatory Visit: Payer: Self-pay

## 2022-10-29 MED ORDER — ALENDRONATE SODIUM 70 MG PO TABS: 70.0000 mg | ORAL_TABLET | ORAL | 3 refills | Status: DC

## 2022-10-29 NOTE — Telephone Encounter (Signed)
Spoke with patient and informed her to pick up her prescription was sent it to [facility]

## 2022-10-29 NOTE — Telephone Encounter (Signed)
Ok to wait or if she wants this resent to another pharmacy ok to do that also.

## 2022-10-29 NOTE — Assessment & Plan Note (Signed)
Father with hip fracture in 34s. Her risk of fracture and hip fracture meet criteria for treatment. Ordered fosamax 70 mg weekly with plans for repeat DEXA in 2 years.

## 2023-02-03 ENCOUNTER — Inpatient Hospital Stay (HOSPITAL_BASED_OUTPATIENT_CLINIC_OR_DEPARTMENT_OTHER): Payer: Medicare Other | Admitting: Oncology

## 2023-02-03 ENCOUNTER — Inpatient Hospital Stay: Payer: Medicare Other | Attending: Oncology

## 2023-02-03 VITALS — BP 145/74 | HR 82 | Temp 98.2°F | Resp 18 | Ht 63.0 in | Wt 126.0 lb

## 2023-02-03 DIAGNOSIS — C183 Malignant neoplasm of hepatic flexure: Secondary | ICD-10-CM | POA: Insufficient documentation

## 2023-02-03 DIAGNOSIS — K635 Polyp of colon: Secondary | ICD-10-CM | POA: Diagnosis not present

## 2023-02-03 LAB — CEA (ACCESS): CEA (CHCC): 2.09 ng/mL (ref 0.00–5.00)

## 2023-02-03 NOTE — Progress Notes (Signed)
  [facility] OFFICE PROGRESS NOTE   Diagnosis: Colon cancer  INTERVAL HISTORY:   Ann [name] returns as scheduled.  She feels well.  Good appetite.  No difficulty with bowel function.  No complaint.  No cough or dyspnea.  Objective:  Vital signs in last 24 hours:  Blood pressure (!) 145/74, pulse 82, temperature 98.2 F (36.8 C), temperature source Oral, resp. rate 18, height '5\' 3"'$  (1.6 m), weight 126 lb (57.2 kg), SpO2 100 %.     Lymphatics: No cervical, supraclavicular, axillary, or inguinal nodes Resp: Coarse end inspiratory rhonchi at the left upper posterior chest with a slight decrease in breath sounds.  The rhonchi cleared after several respirations, no respiratory distress Cardio: Regular rate and rhythm GI: Soft and nontender, no hepatosplenomegaly, no mass Vascular: No leg edema   Lab Results:  Lab Results  Component Value Date   WBC 6.4 08/03/2022   HGB 11.4 (L) 08/03/2022   HCT 35.3 (L) 08/03/2022   MCV 87.2 08/03/2022   PLT 242 08/03/2022   NEUTROABS 4.7 08/03/2022    CMP  Lab Results  Component Value Date   NA 135 06/18/2022   K 3.5 06/25/2022   CL 104 06/18/2022   CO2 23 06/18/2022   GLUCOSE 96 06/18/2022   BUN 16 06/18/2022   CREATININE 1.38 (H) 06/25/2022   CALCIUM 7.8 (L) 06/18/2022   PROT 6.8 04/15/2022   ALBUMIN 3.7 04/15/2022   AST 14 04/15/2022   ALT 9 04/15/2022   ALKPHOS 72 04/15/2022   BILITOT 0.4 04/15/2022   GFRNONAA 38 (L) 06/25/2022    Lab Results  Component Value Date   CEA 2.09 02/03/2023    Medications: I have reviewed the patient's current medications.   Assessment/Plan: Colon cancer-hepatic flexure, stage II (T3N0), status post a robotic right colectomy 06/17/2022 Poorly differentiated adenocarcinoma, grade 3, tumor invades into pericolonic tissue, no lymphovascular perineural invasion, negative margins, 0/22 nodes, no tumor deposits, 2 tubular adenomas with high-grade dysplasia MSI-high, loss of MLH1  and PMS2, MLH1 hypermethylation present CT abdomen/pelvis 04/20/2022-wall thickening of the hepatic flexure with adjacent stranding and luminal narrowing Colonoscopy 05/18/2022-partially obstructing mass at the transverse colon, 3 polyps in the transverse colon, one rectal polyp-transverse colon polyps not removed Mildly elevated preoperative CEA CT chest 08/11/2022-no evidence of metastatic disease Microcytic anemia secondary to #1-improved 08/03/2022 Colon polyps-tubular adenoma removed from the rectum 05/18/2022, 2 tubular adenomas with high-grade dysplasia on the colon resection specimen 06/17/2022     Disposition: Ann [name] in remission from colon cancer.  She has a good prognosis for a long-term disease-free survival.  The CEA remains in the normal range.  She will return for an office visit and CEA in 6 months.  She will seek medical attention for respiratory symptoms.  I will contact Ann [name] to get his recommendation regarding a surveillance colonoscopy.  Ann Coder, MD  02/03/2023  12:13 PM

## 2023-02-04 ENCOUNTER — Telehealth: Payer: Self-pay | Admitting: *Deleted

## 2023-02-04 NOTE — Telephone Encounter (Signed)
Per Dr. Benay Spice: Please let her know Dr. Loletha Carrow wants to see her in the office in June to discuss the need for a colonoscopy.  Patient notified that GI will call her.

## 2023-04-14 ENCOUNTER — Telehealth: Payer: Self-pay

## 2023-04-14 NOTE — Telephone Encounter (Signed)
Called patient to schedule Medicare Annual Wellness Visit (AWV). Left message for patient to call back and schedule Medicare Annual Wellness Visit (AWV).  Last date of AWV: 04/16/23  Please schedule an appointment at any time On Annual Wellness Visit Schedule.

## 2023-05-06 ENCOUNTER — Telehealth: Payer: Self-pay | Admitting: *Deleted

## 2023-05-06 ENCOUNTER — Ambulatory Visit (INDEPENDENT_AMBULATORY_CARE_PROVIDER_SITE_OTHER): Payer: Medicare Other | Admitting: *Deleted

## 2023-05-06 VITALS — BP 160/100 | HR 70 | Temp 97.9°F | Ht 63.0 in | Wt 133.4 lb

## 2023-05-06 DIAGNOSIS — Z0000 Encounter for general adult medical examination without abnormal findings: Secondary | ICD-10-CM

## 2023-05-06 NOTE — Progress Notes (Signed)
Subjective:   Ann [name] is a 88 y.o. female who presents for Medicare Annual (Subsequent) preventive examination.  Review of Systems    Defer to PCP Cardiac Risk Factors include: advanced age (>5men, >70 women)    Please see problem list for additional risk factors Objective:    Today's Vitals   05/06/23 1037 05/06/23 1041  BP: (!) 160/100   Pulse: 70   Temp: 97.9 F (36.6 C)   TempSrc: Oral   SpO2: 98%   Weight: 133 lb 6 oz (60.5 kg)   Height: 5\' 3"  (1.6 m)   PainSc:  0-No pain   Body mass index is 23.63 kg/m.     05/06/2023   10:45 AM 02/03/2023   11:49 AM 08/03/2022    2:03 PM 06/17/2022    6:00 PM 06/09/2022    9:05 AM 04/16/2022    9:49 AM  Advanced Directives  Does Patient Have a Medical Advance Directive? Yes Yes No Yes Yes Yes  Type of Estate agent of [place];Living will Healthcare Power of [place];Living will  Living will Living will;Healthcare Power of [employer] Power of [place];Living will  Does patient want to make changes to medical advance directive?  No - Patient declined  No - Patient declined No - Patient declined   Copy of Healthcare Power of Attorney in Chart?  No - copy requested  No - copy requested No - copy requested No - copy requested  Would patient like information on creating a medical advance directive?   No - Patient declined       Current Medications (verified) Outpatient Encounter Medications as of 05/06/2023  Medication Sig   alendronate (FOSAMAX) 70 MG tablet Take 1 tablet (70 mg total) by mouth every 7 (seven) days. Take with a full glass of water on an empty stomach.   Biotin w/ Vitamins C & E (HAIR/SKIN/NAILS PO) Take 1 tablet by mouth daily.   calcium carbonate (OSCAL) 1500 (600 Ca) MG TABS tablet Take 600 mg of elemental calcium by mouth daily with breakfast.   B Complex Vitamins (B COMPLEX 1 PO) Take 1 tablet by mouth daily. (Patient not taking: Reported on 02/03/2023)   No  facility-administered encounter medications on file as of 05/06/2023.    Allergies (verified) Patient has no known allergies.   History: Past Medical History:  Diagnosis Date   Anemia    Cancer (HCC)    colon   Chicken pox    Past Surgical History:  Procedure Laterality Date   EYE SURGERY     macular hole repair bil cataract bil   Family History  Problem Relation Age of Onset   Cancer Mother    Diabetes Mother    Early death Mother    Arthritis Father    Cancer Sister    Diabetes Sister    Stroke Sister    Heart disease Brother    Heart attack Brother    Colon cancer Neg Hx    Rectal cancer Neg Hx    Stomach cancer Neg Hx    Social History   Socioeconomic History   Marital status: Widowed    Spouse name: Not on file   Number of children: Not on file   Years of education: Not on file   Highest education level: Not on file  Occupational History   Not on file  Tobacco Use   Smoking status: Never   Smokeless tobacco: Never  Vaping Use   Vaping Use: Never used  Substance and Sexual Activity   Alcohol use: Not Currently    Alcohol/week: 1.0 standard drink of alcohol    Types: 1 Glasses of wine per week    Comment: social   Drug use: Never   Sexual activity: Not Currently    Partners: Male  Other Topics Concern   Not on file  Social History Narrative   [place] ancestry   Originally from [place]   Social Determinants of Health   Financial Resource Strain: High Risk (05/06/2023)   Overall Financial Resource Strain (CARDIA)    Difficulty of Paying Living Expenses: Hard  Food Insecurity: No Food Insecurity (05/06/2023)   Hunger Vital Sign    Worried About Running Out of Food in the Last Year: Never true    Ran Out of Food in the Last Year: Never true  Transportation Needs: No Transportation Needs (05/06/2023)   PRAPARE - Administrator, Civil Service (Medical): No    Lack of Transportation (Non-Medical): No  Physical Activity: Insufficiently  Active (05/06/2023)   Exercise Vital Sign    Days of Exercise per Week: 2 days    Minutes of Exercise per Session: 20 min  Stress: No Stress Concern Present (05/06/2023)   [employer] of Occupational Health - Occupational Stress Questionnaire    Feeling of Stress : Not at all  Social Connections: Moderately Isolated (05/06/2023)   Social Connection and Isolation Panel [NHANES]    Frequency of Communication with Friends and Family: More than three times a week    Frequency of Social Gatherings with Friends and Family: More than three times a week    Attends Religious Services: More than 4 times per year    Active Member of [employer] or Organizations: No    Attends Banker Meetings: Never    Marital Status: Widowed    Tobacco Counseling Counseling given: Not Answered   Clinical Intake:  Pre-visit preparation completed: Yes  Pain : No/denies pain Pain Score: 0-No pain     Nutritional Status: BMI of 19-24  Normal Nutritional Risks: None Diabetes: No  How often do you need to have someone help you when you read instructions, pamphlets, or other written materials from your doctor or pharmacy?: 1 - Never What is the last grade level you completed in school?: 4 year college  Diabetic?no  Interpreter Needed?: No      Activities of Daily Living    05/06/2023   10:46 AM 05/04/2023   11:54 AM  In your present state of health, do you have any difficulty performing the following activities:  Hearing? 0 0  Vision? 0 0  Difficulty concentrating or making decisions? 0 0  Walking or climbing stairs? 0 0  Dressing or bathing? 0 0  Doing errands, shopping? 0 0  Preparing Food and eating ? N N  Using the Toilet? N N  In the past six months, have you accidently leaked urine? N N  Do you have problems with loss of bowel control? N N  Managing your Medications? N N  Managing your Finances? N N  Housekeeping or managing your Housekeeping? N N    Patient Care  Team: Myrlene Broker, MD as PCP - General (Internal Medicine) [facility] Sherrilyn Rist, MD as Consulting Physician (Gastroenterology) Karie Soda, MD as Consulting Physician (General Surgery) Luciana Axe Alford Highland, MD as Consulting Physician (Ophthalmology)  Indicate any recent Medical Services you may have received from other than [facility] providers in the past  year (date may be approximate).     Assessment:   This is a routine wellness examination for [facility]  Hearing/Vision screen Vision Screening - Comments:: Annual exam at [facility] eye care  Dietary issues and exercise activities discussed: Current Exercise Habits: Home exercise routine, Time (Minutes): 20, Frequency (Times/Week): 2, Weekly Exercise (Minutes/Week): 40, Intensity: Moderate   Goals Addressed   None   Depression Screen    05/06/2023   10:45 AM 10/27/2022    9:53 AM 04/16/2022    9:50 AM 04/15/2022   10:10 AM 03/27/2021    8:53 AM  PHQ 2/9 Scores  PHQ - 2 Score 0 0 0 0 0  PHQ- 9 Score  0  0     Fall Risk    05/06/2023   10:53 AM 05/04/2023   11:54 AM 10/27/2022    9:52 AM 04/16/2022    9:50 AM 04/15/2022   10:10 AM  Fall Risk   Falls in the past year? 0 0 0 0 1  Number falls in past yr:   0 0 0  Injury with Fall? 0  0 0 0  Risk for fall due to : No Fall Risks      Follow up Falls evaluation completed  Falls evaluation completed Falls evaluation completed     FALL RISK PREVENTION PERTAINING TO THE HOME:  Any stairs in or around the home? Yes  If so, are there any without handrails? No  Home free of loose throw rugs in walkways, pet beds, electrical cords, etc? Yes  Adequate lighting in your home to reduce risk of falls? Yes   ASSISTIVE DEVICES UTILIZED TO PREVENT FALLS:  Life alert? No  Use of a cane, walker or w/c? No  Grab bars in the bathroom? No  Shower chair or bench in shower? No  Elevated toilet seat or a handicapped toilet? No   TIMED UP AND GO:  Was the test  performed? Yes .  Length of time to ambulate 10 feet: 6 sec.   Gait steady and fast without use of assistive device  Cognitive Function:        05/06/2023   10:54 AM  6CIT Screen  What Year? 0 points  What month? 0 points  What time? 0 points  Count back from 20 0 points  Months in reverse 0 points  Repeat phrase 0 points  Total Score 0 points    Immunizations Immunization History  Administered Date(s) Administered   Moderna Covid-19 Vaccine Bivalent Booster 30yrs & up 08/20/2021   Moderna Sars-Covid-2 Vaccination 12/31/2019, 01/28/2020, 10/23/2020    TDAP status: Due, Education has been provided regarding the importance of this vaccine. Advised may receive this vaccine at local pharmacy or Health Dept. Aware to provide a copy of the vaccination record if obtained from local pharmacy or Health Dept. Verbalized acceptance and understanding.  Flu Vaccine status: Up to date  Pneumococcal vaccine status: Declined,  Education has been provided regarding the importance of this vaccine but patient still declined. Advised may receive this vaccine at local pharmacy or Health Dept. Aware to provide a copy of the vaccination record if obtained from local pharmacy or Health Dept. Verbalized acceptance and understanding.   Covid-19 vaccine status: Completed vaccines  Qualifies for Shingles Vaccine? Yes   Zostavax completed No   Shingrix Completed?: No.    Education has been provided regarding the importance of this vaccine. Patient has been advised to call insurance company to determine out of pocket expense if they  have not yet received this vaccine. Advised may also receive vaccine at local pharmacy or Health Dept. Verbalized acceptance and understanding.  Screening Tests Health Maintenance  Topic Date Due   COVID-19 Vaccine (5 - 2023-24 season) 08/20/2022   Pneumonia Vaccine 77+ Years old (1 of 1 - PCV) 10/28/2023 (Originally 08/28/2002)   Zoster Vaccines- Shingrix (1 of 2) 04/21/2024  (Originally 08/28/1954)   COLONOSCOPY (Pts 45-68yrs Insurance coverage will need to be confirmed)  05/19/2023   INFLUENZA VACCINE  07/21/2023   Medicare Annual Wellness (AWV)  05/05/2024   DEXA SCAN  Completed   HPV VACCINES  Aged Out   DTaP/Tdap/Td  Discontinued    Health Maintenance  Health Maintenance Due  Topic Date Due   COVID-19 Vaccine (5 - 2023-24 season) 08/20/2022    Colorectal cancer screening: Type of screening: Colonoscopy. Completed 05/18/2022. Repeat every 1 years  Mammogram status: No longer required due to aged out .  Bone Density status: Completed 10/14/2022. Results reflect: Bone density results: OSTEOPOROSIS. Repeat every 2 years.  Lung Cancer Screening: (Low Dose CT Chest recommended if Age 63-80 years, 30 pack-year currently smoking OR have quit w/in 15years.) does not qualify.   Lung Cancer Screening Referral: n/a  Additional Screening:  Hepatitis C Screening: does qualify; Completed n/a  Vision Screening: Recommended annual ophthalmology exams for early detection of glaucoma and other disorders of the eye. Is the patient up to date with their annual eye exam?  Yes  Who is the provider or what is the name of the office in which the patient attends annual eye exams? Dr. Dione Booze If pt is not established with a provider, would they like to be referred to a provider to establish care? Yes .   Dental Screening: Recommended annual dental exams for proper oral hygiene  Community Resource Referral / Chronic Care Management: CRR required this visit?  No   CCM required this visit?  No      Plan:     I have personally reviewed and noted the following in the patient's chart:   Medical and social history Use of alcohol, tobacco or illicit drugs  Current medications and supplements including opioid prescriptions. Patient is not currently taking opioid prescriptions. Functional ability and status Nutritional status Physical activity Advanced directives List  of other physicians Hospitalizations, surgeries, and ER visits in previous 12 months Vitals Screenings to include cognitive, depression, and falls Referrals and appointments  In addition, I have reviewed and discussed with patient certain preventive protocols, quality metrics, and best practice recommendations. A written personalized care plan for preventive services as well as general preventive health recommendations were provided to patient.     Tamela Oddi, CMA   05/06/2023   Nurse Notes:  Ms. Prisk , Thank you for taking time to come for your Medicare Wellness Visit. I appreciate your ongoing commitment to your health goals. Please review the following plan we discussed and let me know if I can assist you in the future.   These are the goals we discussed:  Goals   None     This is a list of the screening recommended for you and due dates:  Health Maintenance  Topic Date Due   COVID-19 Vaccine (5 - 2023-24 season) 08/20/2022   Pneumonia Vaccine (1 of 1 - PCV) 10/28/2023*   Zoster (Shingles) Vaccine (1 of 2) 04/21/2024*   Colon Cancer Screening  05/19/2023   Flu Shot  07/21/2023   Medicare Annual Wellness Visit  05/05/2024   DEXA  scan (bone density measurement)  Completed   HPV Vaccine  Aged Out   DTaP/Tdap/Td vaccine  Discontinued  *Topic was postponed. The date shown is not the original due date.

## 2023-05-06 NOTE — Patient Instructions (Signed)

## 2023-05-06 NOTE — Telephone Encounter (Signed)
Noted  

## 2023-05-06 NOTE — Telephone Encounter (Signed)
Patient was seen today for AWV, elevatd BP 160/100, patient states that her BP is always elevated when she comes to the doctors office. I made her aware that I will notify the provider. Patient has not been seen in the office since 10/2022. I made her aware that she can make an appt for a f/u visit at her earliest convenience.

## 2023-05-10 ENCOUNTER — Encounter: Payer: Self-pay | Admitting: Gastroenterology

## 2023-07-25 ENCOUNTER — Ambulatory Visit (INDEPENDENT_AMBULATORY_CARE_PROVIDER_SITE_OTHER): Payer: Medicare Other

## 2023-07-25 ENCOUNTER — Ambulatory Visit: Payer: Medicare Other | Admitting: Family Medicine

## 2023-07-25 ENCOUNTER — Encounter: Payer: Self-pay | Admitting: Family Medicine

## 2023-07-25 VITALS — BP 172/90 | HR 81 | Temp 98.1°F | Resp 20 | Ht 63.0 in | Wt 132.0 lb

## 2023-07-25 DIAGNOSIS — I10 Essential (primary) hypertension: Secondary | ICD-10-CM | POA: Diagnosis not present

## 2023-07-25 DIAGNOSIS — M545 Low back pain, unspecified: Secondary | ICD-10-CM

## 2023-07-25 DIAGNOSIS — R140 Abdominal distension (gaseous): Secondary | ICD-10-CM

## 2023-07-25 LAB — COMPREHENSIVE METABOLIC PANEL
ALT: 11 U/L (ref 0–35)
AST: 18 U/L (ref 0–37)
Albumin: 4.2 g/dL (ref 3.5–5.2)
Alkaline Phosphatase: 58 U/L (ref 39–117)
BUN: 17 mg/dL (ref 6–23)
CO2: 22 mEq/L (ref 19–32)
Calcium: 9.4 mg/dL (ref 8.4–10.5)
Chloride: 105 mEq/L (ref 96–112)
Creatinine, Ser: 1.38 mg/dL — ABNORMAL HIGH (ref 0.40–1.20)
GFR: 34.81 mL/min — ABNORMAL LOW (ref >60.00)
Glucose, Bld: 99 mg/dL (ref 70–99)
Potassium: 4.1 mEq/L (ref 3.5–5.1)
Sodium: 137 mEq/L (ref 135–145)
Total Bilirubin: 0.6 mg/dL (ref 0.2–1.2)
Total Protein: 7.4 g/dL (ref 6.0–8.3)

## 2023-07-25 LAB — CBC WITH DIFFERENTIAL/PLATELET
Basophils Absolute: 0.1 10*3/uL (ref 0.0–0.1)
Basophils Relative: 0.9 % (ref 0.0–3.0)
Eosinophils Absolute: 0.1 10*3/uL (ref 0.0–0.7)
Eosinophils Relative: 1.3 % (ref 0.0–5.0)
HCT: 39.1 % (ref 36.0–46.0)
Hemoglobin: 12.8 g/dL (ref 12.0–15.0)
Lymphocytes Relative: 16.3 % (ref 12.0–46.0)
Lymphs Abs: 1 10*3/uL (ref 0.7–4.0)
MCHC: 32.6 g/dL (ref 30.0–36.0)
MCV: 89.4 fl (ref 78.0–100.0)
Monocytes Absolute: 0.6 10*3/uL (ref 0.1–1.0)
Monocytes Relative: 9.4 % (ref 3.0–12.0)
Neutro Abs: 4.3 10*3/uL (ref 1.4–7.7)
Neutrophils Relative %: 72.1 % (ref 43.0–77.0)
Platelets: 217 10*3/uL (ref 150.0–400.0)
RBC: 4.37 Mil/uL (ref 3.87–5.11)
RDW: 13.8 % (ref 11.5–15.5)
WBC: 6 10*3/uL (ref 4.0–10.5)

## 2023-07-25 LAB — MAGNESIUM: Magnesium: 2 mg/dL (ref 1.5–2.5)

## 2023-07-25 LAB — TSH: TSH: 2.75 u[IU]/mL (ref 0.35–5.50)

## 2023-07-25 LAB — VITAMIN B12: Vitamin B-12: 244 pg/mL (ref 211–911)

## 2023-07-25 MED ORDER — LOSARTAN POTASSIUM 50 MG PO TABS
50.0000 mg | ORAL_TABLET | Freq: Every day | ORAL | 2 refills | Status: DC
Start: 2023-07-25 — End: 2023-08-09

## 2023-07-25 NOTE — Progress Notes (Signed)
Assessment & Plan:  1. Bloating Encouraged to keep upcoming appointments with oncology and gastroenterology.  - Comprehensive metabolic panel - CBC with Differential/Platelet - TSH - Magnesium - Vitamin B12  2. Acute bilateral low back pain without sciatica Back exercises provided. Continue heating pad and walking. - DG Lumbar Spine 2-3 Views; Future  3. Primary hypertension Uncontrolled. Education provided on hypertension. Encouraged patient of the continue monitoring her blood pressure, keep a log, and bring it back with her to her next appointment.  Started losartan 50 mg once daily.  Discussed goal blood pressure <150/90. - Comprehensive metabolic panel - CBC with Differential/Platelet - TSH - Magnesium - losartan (COZAAR) 50 MG tablet; Take 1 tablet (50 mg total) by mouth daily.  Dispense: 30 tablet; Refill: 2   Follow up plan: Return in about 2 weeks (around 08/08/2023) for HTN with PCP.  Deliah Boston, MSN, APRN, FNP-C  Subjective:  HPI: Ann [name] is a 88 y.o. female presenting on 07/25/2023 for Back Pain (Low back - pain at times/Mid back - tightness at times), GI Problem (Bloating/BM are always very different : formed, loose, stringy at times /), and Hypertension (Elevated BP on home auto machine (wrist meter) )  Patient is accompanied by her friend, who she is okay with being present.  Patient presents with multiple concerns:  Bloating: Patient reports her bowel movements are very different at different times.  Sometimes they are formed, others they are loose, and others they are stringy. Once she goes through this cycle she feels better.  She does have a history of colon cancer.  She was due for a colonoscopy in June 2024.  She has an appointment with her oncologist on 08/03/2023 and an appointment with her gastroenterologist on 08/11/2023. Lower and middle back pain that feels like a tightness. This occurs at least daily, but only lasts for a short time. Heating pad  and walking are helpful in relieving the tightness.  Hypertension: Patient reports her blood pressure is elevated on her home wrist blood pressure machine.  It has been compared to a manual reading for accuracy. She just started checking her blood pressure at home a few weeks ago and reports her systolic is always high.   ROS: Negative unless specifically indicated above in HPI.   Relevant past medical history reviewed and updated as indicated.   Allergies and medications reviewed and updated.   Current Outpatient Medications:    alendronate (FOSAMAX) 70 MG tablet, Take 1 tablet (70 mg total) by mouth every 7 (seven) days. Take with a full glass of water on an empty stomach., Disp: 12 tablet, Rfl: 3   Biotin w/ Vitamins C & E (HAIR/SKIN/NAILS PO), Take 1 tablet by mouth daily., Disp: , Rfl:    calcium carbonate (OSCAL) 1500 (600 Ca) MG TABS tablet, Take 600 mg of elemental calcium by mouth daily with breakfast., Disp: , Rfl:   No Known Allergies  Objective:   BP (!) 172/90   Pulse 81   Temp 98.1 F (36.7 C)   Resp 20   Ht 5\' 3"  (1.6 m)   Wt 132 lb (59.9 kg)   BMI 23.38 kg/m    Physical Exam Vitals reviewed.  Constitutional:      General: She is not in acute distress.    Appearance: Normal appearance. She is not ill-appearing, toxic-appearing or diaphoretic.  HENT:     Head: Normocephalic and atraumatic.  Eyes:     General: No scleral icterus.  Right eye: No discharge.        Left eye: No discharge.     Conjunctiva/sclera: Conjunctivae normal.  Cardiovascular:     Rate and Rhythm: Normal rate and regular rhythm.     Heart sounds: Normal heart sounds. No murmur heard.    No friction rub. No gallop.  Pulmonary:     Effort: Pulmonary effort is normal. No respiratory distress.     Breath sounds: Normal breath sounds. No stridor. No wheezing, rhonchi or rales.  Abdominal:     General: Abdomen is flat. There is no distension.     Palpations: Abdomen is soft. There is  no mass.     Tenderness: There is no abdominal tenderness. There is no guarding or rebound.  Musculoskeletal:        General: Normal range of motion.     Cervical back: Normal range of motion.  Skin:    General: Skin is warm and dry.     Capillary Refill: Capillary refill takes less than 2 seconds.  Neurological:     General: No focal deficit present.     Mental Status: She is alert and oriented to person, place, and time. Mental status is at baseline.  Psychiatric:        Mood and Affect: Mood normal.        Behavior: Behavior normal.        Thought Content: Thought content normal.        Judgment: Judgment normal.

## 2023-08-03 ENCOUNTER — Inpatient Hospital Stay: Payer: Medicare Other | Admitting: Oncology

## 2023-08-03 ENCOUNTER — Inpatient Hospital Stay: Payer: Medicare Other | Attending: Oncology

## 2023-08-03 VITALS — BP 152/92 | HR 75 | Temp 98.1°F | Resp 18 | Ht 63.0 in | Wt 134.0 lb

## 2023-08-03 DIAGNOSIS — Z85030 Personal history of malignant carcinoid tumor of large intestine: Secondary | ICD-10-CM | POA: Diagnosis present

## 2023-08-03 DIAGNOSIS — C183 Malignant neoplasm of hepatic flexure: Secondary | ICD-10-CM

## 2023-08-03 DIAGNOSIS — R970 Elevated carcinoembryonic antigen [CEA]: Secondary | ICD-10-CM | POA: Diagnosis not present

## 2023-08-03 DIAGNOSIS — R140 Abdominal distension (gaseous): Secondary | ICD-10-CM | POA: Insufficient documentation

## 2023-08-03 LAB — CEA (ACCESS): CEA (CHCC): 1.65 ng/mL (ref 0.00–5.00)

## 2023-08-03 NOTE — Progress Notes (Signed)
  [facility] OFFICE PROGRESS NOTE   Diagnosis: Colon cancer  INTERVAL HISTORY:   Ann [name] returns as scheduled.  Good appetite and energy level.  She reports abdominal "bloating "for the past 3 weeks.  She is scheduled to see gastroenterology next week.  She reports the bloating has resolved over the past few days.  No rectal bleeding.  Objective:  Vital signs in last 24 hours:  Blood pressure (!) 152/92, pulse 75, temperature 98.1 F (36.7 C), temperature source Oral, resp. rate 18, height 5\' 3"  (1.6 m), weight 134 lb (60.8 kg), SpO2 100%.    Lymphatics: No cervical, supraclavicular, axillary, or inguinal nodes Resp: Lungs clear bilaterally Cardio: Regular rate and rhythm GI: No hepatosplenomegaly, no mass, nontender Vascular: No leg edema   Lab Results:  Lab Results  Component Value Date   WBC 6.0 07/25/2023   HGB 12.8 07/25/2023   HCT 39.1 07/25/2023   MCV 89.4 07/25/2023   PLT 217.0 07/25/2023   NEUTROABS 4.3 07/25/2023    CMP  Lab Results  Component Value Date   NA 137 07/25/2023   K 4.1 07/25/2023   CL 105 07/25/2023   CO2 22 07/25/2023   GLUCOSE 99 07/25/2023   BUN 17 07/25/2023   CREATININE 1.38 (H) 07/25/2023   CALCIUM 9.4 07/25/2023   PROT 7.4 07/25/2023   ALBUMIN 4.2 07/25/2023   AST 18 07/25/2023   ALT 11 07/25/2023   ALKPHOS 58 07/25/2023   BILITOT 0.6 07/25/2023   GFRNONAA 38 (L) 06/25/2022    Lab Results  Component Value Date   CEA 2.09 02/03/2023    Medications: I have reviewed the patient's current medications.   Assessment/Plan: Colon cancer-hepatic flexure, stage II (T3N0), status post a robotic right colectomy 06/17/2022 Poorly differentiated adenocarcinoma, grade 3, tumor invades into pericolonic tissue, no lymphovascular perineural invasion, negative margins, 0/22 nodes, no tumor deposits, 2 tubular adenomas with high-grade dysplasia MSI-high, loss of MLH1 and PMS2, MLH1 hypermethylation present CT  abdomen/pelvis 04/20/2022-wall thickening of the hepatic flexure with adjacent stranding and luminal narrowing Colonoscopy 05/18/2022-partially obstructing mass at the transverse colon, 3 polyps in the transverse colon, one rectal polyp-transverse colon polyps not removed Mildly elevated preoperative CEA CT chest 08/11/2022-no evidence of metastatic disease Microcytic anemia secondary to #1-improved 08/03/2022 Colon polyps-tubular adenoma removed from the rectum 05/18/2022, 2 tubular adenomas with high-grade dysplasia on the colon resection specimen 06/17/2022       Disposition: Ann [name] is in clinical remission from colon cancer.  She will return for an office visit and CEA in 6 months.  She is scheduled to see gastroenterology next week for evaluation of the recent abdominal bloating and to consider the indication for a surveillance colonoscopy.  Thornton Papas, MD  08/03/2023  11:14 AM

## 2023-08-09 ENCOUNTER — Ambulatory Visit (INDEPENDENT_AMBULATORY_CARE_PROVIDER_SITE_OTHER): Payer: Medicare Other | Admitting: Internal Medicine

## 2023-08-09 ENCOUNTER — Encounter: Payer: Self-pay | Admitting: Internal Medicine

## 2023-08-09 VITALS — BP 138/76 | HR 75 | Temp 98.3°F | Ht 63.0 in | Wt 133.0 lb

## 2023-08-09 DIAGNOSIS — I10 Essential (primary) hypertension: Secondary | ICD-10-CM | POA: Insufficient documentation

## 2023-08-09 LAB — COMPREHENSIVE METABOLIC PANEL
ALT: 11 U/L (ref 0–35)
AST: 17 U/L (ref 0–37)
Albumin: 4.2 g/dL (ref 3.5–5.2)
Alkaline Phosphatase: 65 U/L (ref 39–117)
BUN: 19 mg/dL (ref 6–23)
CO2: 21 mEq/L (ref 19–32)
Calcium: 9.1 mg/dL (ref 8.4–10.5)
Chloride: 108 mEq/L (ref 96–112)
Creatinine, Ser: 1.28 mg/dL — ABNORMAL HIGH (ref 0.40–1.20)
GFR: 38.08 mL/min — ABNORMAL LOW (ref >60.00)
Glucose, Bld: 107 mg/dL — ABNORMAL HIGH (ref 70–99)
Potassium: 4.3 mEq/L (ref 3.5–5.1)
Sodium: 136 mEq/L (ref 135–145)
Total Bilirubin: 0.5 mg/dL (ref 0.2–1.2)
Total Protein: 7.4 g/dL (ref 6.0–8.3)

## 2023-08-09 MED ORDER — LOSARTAN POTASSIUM 50 MG PO TABS
50.0000 mg | ORAL_TABLET | Freq: Every day | ORAL | 3 refills | Status: DC
Start: 2023-08-09 — End: 2023-11-09

## 2023-08-09 NOTE — Progress Notes (Signed)
   Subjective:   Patient ID: Ann [name], female    DOB: [birth year], 88 y.o.   MRN: [id number]  HPI The patient is an 88 YO female coming in for BP follow up. Started losartan 50 mg daily about 2 weeks ago. No side effects.   Review of Systems  Constitutional: Negative.   HENT: Negative.    Eyes: Negative.   Respiratory:  Negative for cough, chest tightness and shortness of breath.   Cardiovascular:  Negative for chest pain, palpitations and leg swelling.  Gastrointestinal:  Positive for abdominal distention. Negative for abdominal pain, constipation, diarrhea, nausea and vomiting.  Musculoskeletal: Negative.   Skin: Negative.   Neurological: Negative.   Psychiatric/Behavioral: Negative.      Objective:  Physical Exam Constitutional:      Appearance: She is well-developed.  HENT:     Head: Normocephalic and atraumatic.  Cardiovascular:     Rate and Rhythm: Normal rate and regular rhythm.  Pulmonary:     Effort: Pulmonary effort is normal. No respiratory distress.     Breath sounds: Normal breath sounds. No wheezing or rales.  Abdominal:     General: Bowel sounds are normal. There is no distension.     Palpations: Abdomen is soft.     Tenderness: There is no abdominal tenderness. There is no rebound.  Musculoskeletal:     Cervical back: Normal range of motion.  Skin:    General: Skin is warm and dry.  Neurological:     Mental Status: She is alert and oriented to person, place, and time.     Coordination: Coordination normal.     Vitals:   08/09/23 1015 08/09/23 1017 08/09/23 1030  BP: (!) 146/80 (!) 146/80 138/76  Pulse: 75    Temp: 98.3 F (36.8 C)    TempSrc: Oral    SpO2: 98%    Weight: 133 lb (60.3 kg)    Height: 5\' 3"  (1.6 m)      Assessment & Plan:

## 2023-08-09 NOTE — Assessment & Plan Note (Signed)
Checking CMP today given new start of losartan 50 mg daily. BP is at goal. Will continue refill done.

## 2023-08-09 NOTE — Patient Instructions (Signed)
We will check the labs today and keep the losartan the same.

## 2023-08-11 ENCOUNTER — Ambulatory Visit (INDEPENDENT_AMBULATORY_CARE_PROVIDER_SITE_OTHER): Payer: Medicare Other | Admitting: Nurse Practitioner

## 2023-08-11 ENCOUNTER — Encounter: Payer: Self-pay | Admitting: Nurse Practitioner

## 2023-08-11 VITALS — BP 124/84 | HR 80 | Ht 63.0 in | Wt 134.0 lb

## 2023-08-11 DIAGNOSIS — R140 Abdominal distension (gaseous): Secondary | ICD-10-CM

## 2023-08-11 DIAGNOSIS — Z85038 Personal history of other malignant neoplasm of large intestine: Secondary | ICD-10-CM

## 2023-08-11 MED ORDER — NA SULFATE-K SULFATE-MG SULF 17.5-3.13-1.6 GM/177ML PO SOLN: 1.0000 | Freq: Once | ORAL | 0 refills | Status: AC

## 2023-08-11 MED ORDER — ONDANSETRON 4 MG PO TBDP: ORAL_TABLET | ORAL | 0 refills | Status: DC

## 2023-08-11 MED ORDER — ONDANSETRON HCL 4 MG PO TABS: ORAL_TABLET | ORAL | 0 refills | Status: DC

## 2023-08-11 NOTE — Patient Instructions (Addendum)
You have been scheduled for a colonoscopy. Please follow written instructions given to you at your visit today.   Please pick up your prep supplies at the pharmacy within the next 1-3 days.  If you use inhalers (even only as needed), please bring them with you on the day of your procedure.  DO NOT TAKE 7 DAYS PRIOR TO TEST- Trulicity (dulaglutide) Ozempic, Wegovy (semaglutide) Mounjaro (tirzepatide) Bydureon Bcise (exanatide extended release)  DO NOT TAKE 1 DAY PRIOR TO YOUR TEST Rybelsus (semaglutide) Adlyxin (lixisenatide) Victoza (liraglutide) Byetta (exanatide) ________________________________________________  IBGuard-1 by mouth twice daily as needed for abdominal bloating  Take 1 tablet of Zofran 30 minutes before starting each dose of Suprep.  Due to recent changes in healthcare laws, you may see the results of your imaging and laboratory studies on MyChart before your provider has had a chance to review them.  We understand that in some cases there may be results that are confusing or concerning to you. Not all laboratory results come back in the same time frame and the provider may be waiting for multiple results in order to interpret others.  Please give [place] 48 hours in order for your provider to thoroughly review all the results before contacting the office for clarification of your results.    Thank you for trusting me with your gastrointestinal care!   Alcide Evener, CRNP

## 2023-08-11 NOTE — Progress Notes (Signed)
08/11/2023 Ann [name] [id number] 07-20-1935   CHIEF COMPLAINT: Schedule a colonoscopy, abdominal bloat  HISTORY OF PRESENT ILLNESS: Ann [name] is an 88 year old female with a past medical history of hypertension, osteoporosis and colon adenocarcinoma s/p robotic right colectomy 06/17/2022. She developed RLQ pain with weight loss 04/2022 and CTAP at that time showed a mass at the hepatic flexure. She was seen in office by Dr. Myrtie Neither and she subsequently underwent a colonoscopy 05/18/2022 which identified a malignant partially obstructing mass in the transverse colon and three 8 to 12 mm tubular adenomatous polyps in the transverse colon, one 6 mm adenomatous polyp in the rectum was removed and diverticulosis to the sigmoid colon. This was her first and only colonoscopy. She underwent a robotic right colectomy by Dr. Karie Soda 06/17/2022, path report confirmed poorly differentiated adenocarcinoma and the transverse polyps were tubular adenomas. No postoperative complications. Chemotherapy was not required. She presents today to schedule a surveillance colonoscopy. She was last seen by her oncologist Dr. Truett Perna 08/03/2023 and at that time she was in clinical remission and she noted having bloating for the past 3 weeks which abated 2 to 3 days ago. One month ago, she also noted a change in her bowel pattern. She described passing blood like, watery, stringy and pebble-like stools, bowel pattern varied from day-to-day. Over the past few days, her bowel pattern normalized. She passed a normal brown formed stool earlier today.  No rectal bleeding or black stools.  She was recently seen by her PCP and was noted to have elevated SBP in the 150s to 160s and she was started on Losartan 50 mg daily.  Her appetite is good.  Her weight is stable.  She walks 20 minutes daily. No history of pulmonary or cardiac disease.     Latest Ref Rng & Units 07/25/2023   12:13 PM 08/03/2022    2:45 PM 06/22/2022    4:21 AM   CBC  WBC 4.0 - 10.5 K/uL 6.0  6.4  11.5   Hemoglobin 12.0 - 15.0 g/dL 02.7  25.3  9.7   Hematocrit 36.0 - 46.0 % 39.1  35.3  30.4   Platelets 150.0 - 400.0 K/uL 217.0  242  485         Latest Ref Rng & Units 08/09/2023   10:39 AM 07/25/2023   12:13 PM 06/25/2022    5:32 AM  CMP  Glucose 70 - 99 mg/dL 664  99    BUN 6 - 23 mg/dL 19  17    Creatinine 4.03 - 1.20 mg/dL 4.74  2.59  5.63   Sodium 135 - 145 mEq/L 136  137    Potassium 3.5 - 5.1 mEq/L 4.3  4.1  3.5   Chloride 96 - 112 mEq/L 108  105    CO2 19 - 32 mEq/L 21  22    Calcium 8.4 - 10.5 mg/dL 9.1  9.4    Total Protein 6.0 - 8.3 g/dL 7.4  7.4    Total Bilirubin 0.2 - 1.2 mg/dL 0.5  0.6    Alkaline Phos 39 - 117 U/L 65  58    AST 0 - 37 U/L 17  18    ALT 0 - 35 U/L 11  11      Colonoscopy 05/18/2022: - Likely malignant partially obstructing tumor in the transverse colon. Biopsied. Tattooed.  - Three 8 to 12 mm polyps in the transverse colon.  - Diverticulosis in the sigmoid colon.  -  One 6 mm polyp in the rectum, removed with a cold snare. Resected and retrieved.  - Internal hemorrhoids.  - The examination was otherwise normal on direct and retroflexion views. 1. Transverse Colon Biopsy, mass - POORLY DIFFERENTIATED ADENOCARCINOMA, SEE NOTE 2. Rectum, polyp(s) - TUBULAR ADENOMA - NEGATIVE FOR HIGH-GRADE DYSPLASIA OR MALIGNANCY  S/P robotic right colectomy 06/17/2022: A. COLON, RIGHT, COLECTOMY:  - Poorly differentiated adenocarcinoma.  See comment.  - Surgical resection margins clear.  - Tubular adenomas (2) with high-grade dysplasia.  - Fibrous obliteration of appendix.  - No metastatic carcinoma identified in any of 22 lymph nodes (0/22).    Past Medical History:  Diagnosis Date   Anemia    Chicken pox    Colon cancer (HCC)    Hypertension    Osteoporosis    Past Surgical History:  Procedure Laterality Date   COLONOSCOPY     EYE SURGERY Bilateral    macular hole repair bil cataract bil   No Known  Allergies   Outpatient Encounter Medications as of 08/11/2023  Medication Sig   alendronate (FOSAMAX) 70 MG tablet Take 1 tablet (70 mg total) by mouth every 7 (seven) days. Take with a full glass of water on an empty stomach.   Biotin w/ Vitamins C & E (HAIR/SKIN/NAILS PO) Take 1 tablet by mouth daily.   calcium carbonate (OSCAL) 1500 (600 Ca) MG TABS tablet Take 600 mg of elemental calcium by mouth daily with breakfast.   losartan (COZAAR) 50 MG tablet Take 1 tablet (50 mg total) by mouth daily.   No facility-administered encounter medications on file as of 08/11/2023.   REVIEW OF SYSTEMS:  Gen: Denies fever, sweats or chills. No weight loss.  CV: Denies chest pain, palpitations or edema. Resp: Denies cough, shortness of breath of hemoptysis.  GI: See  HPI. GU: Denies urinary burning, blood in urine, increased urinary frequency or incontinence. MS: Denies joint pain, muscles aches or weakness. Derm: Denies rash, itchiness, skin lesions or unhealing ulcers. Psych: Denies depression, anxiety, memory loss or confusion. Heme: Denies bruising, easy bleeding. Neuro:  Denies headaches, dizziness or paresthesias. Endo:  Denies any problems with DM, thyroid or adrenal function.  PHYSICAL EXAM: BP 124/84   Pulse 80   Ht 5\' 3"  (1.6 m)   Wt 134 lb (60.8 kg)   BMI 23.74 kg/m  General: 88 year old female in no acute distress. Head: Normocephalic and atraumatic. Eyes:  Sclerae non-icteric, conjunctive pink. Ears: Normal auditory acuity. Mouth: Dentition intact. No ulcers or lesions.  Neck: Supple, no lymphadenopathy or thyromegaly.  Lungs: Clear bilaterally to auscultation without wheezes, crackles or rhonchi. Heart: Regular rate and rhythm. No murmur, rub or gallop appreciated.  Abdomen: Soft, nontender, nondistended. No masses. No hepatosplenomegaly. Normoactive bowel sounds x 4 quadrants.  Laparoscopic scars intact. Rectal: Deferred. Musculoskeletal: Symmetrical with no gross  deformities. Skin: Warm and dry. No rash or lesions on visible extremities. Extremities: No edema. Neurological: Alert oriented x 4, no focal deficits.  Psychological:  Alert and cooperative. Normal mood and affect.  ASSESSMENT AND PLAN:  88 year old female with a history of colon adenocarcinoma s/p robotic right colectomy 06/17/2022.  Chemotherapy was not wired. -Surveillance colonoscopy benefits and risks discussed including risk with sedation, risk of bleeding, perforation and infection   History of tubular adenomatous and rectal polyps per colonoscopy 04/2022 See plan above  Abdominal bloat with associated variable bowel pattern x one month abated several days ago -IBgard 1 p.o. twice daily as needed -Drink 6 to 8  glasses of water daily -Diet as tolerated -Patient to contact office if abdominal bloat and variable bowel pattern recurs  Hypertension, recently started on Losartan 50 mg daily    CC:  Myrlene Broker, *

## 2023-08-13 NOTE — Progress Notes (Signed)
____________________________________________________________  Attending physician addendum:  Thank you for sending this case to me. I have reviewed the entire note and agree with the plan.  I recall this patient, and I am glad to hear she is still in good health.  I agree with the colonoscopy.  Regarding the reported bloating, if it should be a recurrent or chronic problem, consider the possibility of SIBO in a patient who had a right hemicolectomy.  Amada Jupiter, MD  ____________________________________________________________

## 2023-09-15 ENCOUNTER — Ambulatory Visit: Payer: Medicare Other | Admitting: Gastroenterology

## 2023-09-15 ENCOUNTER — Encounter: Payer: Self-pay | Admitting: Gastroenterology

## 2023-09-15 VITALS — BP 147/73 | HR 62 | Temp 98.9°F | Resp 19 | Ht 63.0 in | Wt 134.0 lb

## 2023-09-15 DIAGNOSIS — Z08 Encounter for follow-up examination after completed treatment for malignant neoplasm: Secondary | ICD-10-CM

## 2023-09-15 DIAGNOSIS — Z85038 Personal history of other malignant neoplasm of large intestine: Secondary | ICD-10-CM | POA: Diagnosis not present

## 2023-09-15 MED ORDER — SODIUM CHLORIDE 0.9 % IV SOLN: 500.0000 mL | Freq: Once | INTRAVENOUS | Status: DC

## 2023-09-15 NOTE — Progress Notes (Signed)
History and Physical:  This patient presents for endoscopic testing for: Encounter Diagnosis  Name Primary?   History of colon cancer Yes   88 year old woman last seen in our office 08/11/2027 for consideration of colon cancer surveillance.  Robotic right hemicolectomy for poorly differentiated adenocarcinoma June 2023.  Node negative, no adjuvant therapy.  Additional adenomatous polyps with high-grade dysplasia in the resection specimen. No significant changes were identified.  The patient continues to be an appropriate candidate for the planned procedure and anesthesia.  She experienced some bloating about a month ago but it resolved.  Bowel habits are regular without bleeding.  Patient is otherwise without complaints or active issues today.   Past Medical History: Past Medical History:  Diagnosis Date   Anemia    Cataract    Chicken pox    Colon cancer (HCC)    Hypertension    Osteoporosis      Past Surgical History: Past Surgical History:  Procedure Laterality Date   COLONOSCOPY     EYE SURGERY Bilateral    macular hole repair bil cataract bil   robotic right colectomy  06/17/2022    Allergies: No Known Allergies  Outpatient Meds: Current Outpatient Medications  Medication Sig Dispense Refill   alendronate (FOSAMAX) 70 MG tablet Take 1 tablet (70 mg total) by mouth every 7 (seven) days. Take with a full glass of water on an empty stomach. 12 tablet 3   Biotin w/ Vitamins C & E (HAIR/SKIN/NAILS PO) Take 1 tablet by mouth daily.     calcium carbonate (OSCAL) 1500 (600 Ca) MG TABS tablet Take 600 mg of elemental calcium by mouth daily with breakfast.     losartan (COZAAR) 50 MG tablet Take 1 tablet (50 mg total) by mouth daily. 90 tablet 3   ondansetron (ZOFRAN-ODT) 4 MG disintegrating tablet Dissolve 1 tablet under tongue 30 minutes prior to starting each dose of Suprep 2 tablet 0   No current facility-administered medications for this visit.       ___________________________________________________________________ Objective   Exam:  BP (!) 163/82   Pulse 80   Temp 98.9 F (37.2 C)   Resp 15   Ht 5\' 3"  (1.6 m)   Wt 134 lb (60.8 kg)   SpO2 98%   BMI 23.74 kg/m   CV: regular , S1/S2 Resp: clear to auscultation bilaterally, normal RR and effort noted GI: soft, no tenderness, with active bowel sounds.   Assessment: Encounter Diagnosis  Name Primary?   History of colon cancer Yes     Plan: Colonoscopy   The benefits and risks of the planned procedure were described in detail with the patient or (when appropriate) their health care proxy.  Risks were outlined as including, but not limited to, bleeding, infection, perforation, adverse medication reaction leading to cardiac or pulmonary decompensation, pancreatitis (if ERCP).  The limitation of incomplete mucosal visualization was also discussed.  No guarantees or warranties were given.  The patient is appropriate for an endoscopic procedure in the ambulatory setting.   - Amada Jupiter, MD

## 2023-09-15 NOTE — Progress Notes (Signed)
Uneventful anesthetic. Report to pacu rn. Vss. Care resumed by rn. 

## 2023-09-15 NOTE — Op Note (Signed)
[facility] Patient Name: Ann [name] Procedure Date: 09/15/2023 1:10 PM MRN: [id number] Endoscopist: Sherilyn Cooter L. Myrtie Neither , MD, [id number] Age: 88 Referring MD:  Date of Birth: 1935/05/14 Gender: Female Account #: [id number] Procedure:                Colonoscopy Indications:              High risk colon cancer surveillance: Personal                            history of colon cancer                           Node negative hepatic flexure adenocarcinoma                            resected July 2023, no adjuvant therapy. This is                            first postoperative surveillance exam. Procedure:                Pre-Anesthesia Assessment:                           - Prior to the procedure, a History and Physical                            was performed, and patient medications and                            allergies were reviewed. The patient's tolerance of                            previous anesthesia was also reviewed. The risks                            and benefits of the procedure and the sedation                            options and risks were discussed with the patient.                            All questions were answered, and informed consent                            was obtained. Prior Anticoagulants: The patient has                            taken no anticoagulant or antiplatelet agents. ASA                            Grade Assessment: III - A patient with severe                            systemic disease. After reviewing the risks and  benefits, the patient was deemed in satisfactory                            condition to undergo the procedure.                           After obtaining informed consent, the colonoscope                            was passed under direct vision. Throughout the                            procedure, the patient's blood pressure, pulse, and                            oxygen saturations were  monitored continuously. The                            Olympus Scope [id number] was introduced through the                            anus and advanced to the the ileocolonic                            anastomosis and neo-terminal ileum. The colonoscopy                            was performed without difficulty. The patient                            tolerated the procedure well. The quality of the                            bowel preparation was good. The neo-terminal ileum,                            the rectum and ileo-colonic anastomosis were                            photographed. Scope In: 1:21:14 PM Scope Out: 1:29:54 PM Scope Withdrawal Time: 0 hours 5 minutes 50 seconds  Total Procedure Duration: 0 hours 8 minutes 40 seconds  Findings:                 The perianal and digital rectal examinations were                            normal.                           There was evidence of a prior side-to-side                            ileo-colonic anastomosis in the mid transverse  colon. This was patent and was characterized by                            healthy appearing mucosa.                           The neo-terminal ileum appeared normal.                           Diverticula were found in the left colon.                           Internal hemorrhoids were found.                           The exam was otherwise without abnormality on                            direct and retroflexion views. Complications:            No immediate complications. Estimated Blood Loss:     Estimated blood loss: none. Impression:               - Patent side-to-side ileo-colonic anastomosis,                            characterized by healthy appearing mucosa.                           - The examined portion of the ileum was normal.                           - Diverticulosis in the left colon.                           - Internal hemorrhoids.                           -  The examination was otherwise normal on direct                            and retroflexion views.                           - No specimens collected. Recommendation:           - Patient has a contact number available for                            emergencies. The signs and symptoms of potential                            delayed complications were discussed with the                            patient. Return to normal activities tomorrow.  Written discharge instructions were provided to the                            patient.                           - Resume previous diet.                           - Continue present medications.                           - No repeat surveillance colonoscopy recommended                            due to age and no polyps found today.Marland Kitchen Klye Besecker L. Myrtie Neither, MD 09/15/2023 1:36:28 PM This report has been signed electronically.

## 2023-09-15 NOTE — Patient Instructions (Signed)
Thank you for letting us take care of your healthcare needs today.    YOU HAD AN ENDOSCOPIC PROCEDURE TODAY AT THE [facility]:   Refer to the procedure report that was given to you for any specific questions about what was found during the examination.  If the procedure report does not answer your questions, please call your gastroenterologist to clarify.  If you requested that your care partner not be given the details of your procedure findings, then the procedure report has been included in a sealed envelope for you to review at your convenience later.  YOU SHOULD EXPECT: Some feelings of bloating in the abdomen. Passage of more gas than usual.  Walking can help get rid of the air that was put into your GI tract during the procedure and reduce the bloating. If you had a lower endoscopy (such as a colonoscopy or flexible sigmoidoscopy) you may notice spotting of blood in your stool or on the toilet paper. If you underwent a bowel prep for your procedure, you may not have a normal bowel movement for a few days.  Please Note:  You might notice some irritation and congestion in your nose or some drainage.  This is from the oxygen used during your procedure.  There is no need for concern and it should clear up in a day or so.  SYMPTOMS TO REPORT IMMEDIATELY:  Following lower endoscopy (colonoscopy or flexible sigmoidoscopy):  Excessive amounts of blood in the stool  Significant tenderness or worsening of abdominal pains  Swelling of the abdomen that is new, acute  Fever of 100F or higher   For urgent or emergent issues, a gastroenterologist can be reached at any hour by calling [phone]. Do not use MyChart messaging for urgent concerns.    DIET:  We do recommend a small meal at first, but then you may proceed to your regular diet.  Drink plenty of fluids but you should avoid alcoholic beverages for 24 hours.  ACTIVITY:  You should plan to take it easy for the rest of today  and you should NOT DRIVE or use heavy machinery until tomorrow (because of the sedation medicines used during the test).    FOLLOW UP: Our staff will call the number listed on your records the next business day following your procedure.  We will call around 7:15- 8:00 am to check on you and address any questions or concerns that you may have regarding the information given to you following your procedure. If we do not reach you, we will leave a message.     If any biopsies were taken you will be contacted by phone or by letter within the next 1-3 weeks.  Please call us at [phone] if you have not heard about the biopsies in 3 weeks.    SIGNATURES/CONFIDENTIALITY: You and/or your care partner have signed paperwork which will be entered into your electronic medical record.  These signatures attest to the fact that that the information above on your After Visit Summary has been reviewed and is understood.  Full responsibility of the confidentiality of this discharge information lies with you and/or your care-partner.

## 2023-09-16 ENCOUNTER — Telehealth: Payer: Self-pay | Admitting: *Deleted

## 2023-09-16 NOTE — Telephone Encounter (Signed)
  Follow up Call-     09/15/2023   12:37 PM 05/18/2022    3:36 PM  Call back number  Post procedure Call Back phone  # [phone]  Permission to leave phone message Yes Yes     Patient questions:  Do you have a fever, pain , or abdominal swelling? No. Pain Score  0 *  Have you tolerated food without any problems? Yes.    Have you been able to return to your normal activities? Yes.    Do you have any questions about your discharge instructions: Diet   No. Medications  No. Follow up visit  No.  Do you have questions or concerns about your Care? No.  Actions: * If pain score is 4 or above: No action needed, pain <4.

## 2023-09-24 ENCOUNTER — Other Ambulatory Visit: Payer: Self-pay | Admitting: Internal Medicine

## 2023-11-09 ENCOUNTER — Ambulatory Visit (INDEPENDENT_AMBULATORY_CARE_PROVIDER_SITE_OTHER): Payer: Medicare Other | Admitting: Internal Medicine

## 2023-11-09 ENCOUNTER — Encounter: Payer: Self-pay | Admitting: Internal Medicine

## 2023-11-09 VITALS — BP 186/100 | HR 68 | Temp 98.1°F | Ht 63.0 in | Wt 137.0 lb

## 2023-11-09 DIAGNOSIS — I10 Essential (primary) hypertension: Secondary | ICD-10-CM

## 2023-11-09 MED ORDER — LOSARTAN POTASSIUM 100 MG PO TABS: 100.0000 mg | ORAL_TABLET | Freq: Every day | ORAL | 3 refills | Status: DC

## 2023-11-09 NOTE — Progress Notes (Unsigned)
   Subjective:   Patient ID: Ann [name], female    DOB: December 06, 1935, 88 y.o.   MRN: [id number]  HPI The patient is an 88 YO female coming in for high BP follow up. Taking losartan 50 mg daily and feels well. BP log and often in 140s/150s/80s.   Review of Systems  Constitutional: Negative.   HENT: Negative.    Eyes: Negative.   Respiratory:  Negative for cough, chest tightness and shortness of breath.   Cardiovascular:  Negative for chest pain, palpitations and leg swelling.  Gastrointestinal:  Negative for abdominal distention, abdominal pain, constipation, diarrhea, nausea and vomiting.  Musculoskeletal: Negative.   Skin: Negative.   Neurological: Negative.   Psychiatric/Behavioral: Negative.      Objective:  Physical Exam Constitutional:      Appearance: She is well-developed.  HENT:     Head: Normocephalic and atraumatic.  Cardiovascular:     Rate and Rhythm: Normal rate and regular rhythm.  Pulmonary:     Effort: Pulmonary effort is normal. No respiratory distress.     Breath sounds: Normal breath sounds. No wheezing or rales.  Abdominal:     General: Bowel sounds are normal. There is no distension.     Palpations: Abdomen is soft.     Tenderness: There is no abdominal tenderness. There is no rebound.  Musculoskeletal:     Cervical back: Normal range of motion.  Skin:    General: Skin is warm and dry.  Neurological:     Mental Status: She is alert and oriented to person, place, and time.     Coordination: Coordination normal.     Vitals:   11/09/23 0941 11/09/23 0947  BP: (!) 186/100 (!) 186/100  Pulse: 68   Temp: 98.1 F (36.7 C)   TempSrc: Oral   SpO2: 99%   Weight: 137 lb (62.1 kg)   Height: 5\' 3"  (1.6 m)     Assessment & Plan:

## 2023-11-09 NOTE — Patient Instructions (Signed)
We will increase the losartan to 100 mg daily

## 2023-11-09 NOTE — Assessment & Plan Note (Signed)
BP average is still above goal and is around mid 140s/80s per home BP log. We will increase losartan to 100 mg daily.

## 2024-02-03 ENCOUNTER — Other Ambulatory Visit: Payer: Medicare Other

## 2024-02-03 ENCOUNTER — Inpatient Hospital Stay: Payer: Medicare Other | Attending: Oncology | Admitting: Oncology

## 2024-02-03 ENCOUNTER — Inpatient Hospital Stay: Payer: Medicare Other

## 2024-02-03 VITALS — BP 157/83 | HR 86 | Temp 98.2°F | Resp 18 | Ht 63.0 in | Wt 137.9 lb

## 2024-02-03 DIAGNOSIS — C183 Malignant neoplasm of hepatic flexure: Secondary | ICD-10-CM

## 2024-02-03 DIAGNOSIS — I10 Essential (primary) hypertension: Secondary | ICD-10-CM | POA: Diagnosis not present

## 2024-02-03 LAB — CEA (ACCESS): CEA (CHCC): 1.75 ng/mL (ref 0.00–5.00)

## 2024-02-03 NOTE — Progress Notes (Signed)
  [facility] OFFICE PROGRESS NOTE   Diagnosis: Colon cancer  INTERVAL HISTORY:   [name] returns as scheduled.  She feels well.  No complaint.  Good appetite.  No difficulty with bowel function.  She underwent a colonoscopy 09/15/2023 no polyps were found.  She is now on medication for hypertension.  Objective:  Vital signs in last 24 hours:  Blood pressure (!) 157/83, pulse 86, temperature 98.2 F (36.8 C), temperature source Temporal, resp. rate 18, height 5\' 3"  (1.6 m), weight 137 lb 14.4 oz (62.6 kg), SpO2 100%.    Lymphatics: "Shotty "bilateral low posterior cervical nodes, no supraclavicular, axillary, or inguinal nodes Resp: Lungs clear bilaterally Cardio: Irregular rate, varies with respiration GI: Nontender, no mass, no hepatosplenomegaly Vascular: No leg edema   Lab Results:  Lab Results  Component Value Date   WBC 6.0 07/25/2023   HGB 12.8 07/25/2023   HCT 39.1 07/25/2023   MCV 89.4 07/25/2023   PLT 217.0 07/25/2023   NEUTROABS 4.3 07/25/2023    CMP  Lab Results  Component Value Date   NA 136 08/09/2023   K 4.3 08/09/2023   CL 108 08/09/2023   CO2 21 08/09/2023   GLUCOSE 107 (H) 08/09/2023   BUN 19 08/09/2023   CREATININE 1.28 (H) 08/09/2023   CALCIUM 9.1 08/09/2023   PROT 7.4 08/09/2023   ALBUMIN 4.2 08/09/2023   AST 17 08/09/2023   ALT 11 08/09/2023   ALKPHOS 65 08/09/2023   BILITOT 0.5 08/09/2023   GFRNONAA 38 (L) 06/25/2022    Lab Results  Component Value Date   CEA 1.75 02/03/2024    No results found for: "INR", "LABPROT"  Imaging:  No results found.  Medications: I have reviewed the patient's current medications.   Assessment/Plan: Colon cancer-hepatic flexure, stage II (T3N0), status post a robotic right colectomy 06/17/2022 Poorly differentiated adenocarcinoma, grade 3, tumor invades into pericolonic tissue, no lymphovascular perineural invasion, negative margins, 0/22 nodes, no tumor deposits, 2 tubular  adenomas with high-grade dysplasia MSI-high, loss of MLH1 and PMS2, MLH1 hypermethylation present CT abdomen/pelvis 04/20/2022-wall thickening of the hepatic flexure with adjacent stranding and luminal narrowing Colonoscopy 05/18/2022-partially obstructing mass at the transverse colon, 3 polyps in the transverse colon, one rectal polyp-transverse colon polyps not removed Mildly elevated preoperative CEA CT chest 08/11/2022-no evidence of metastatic disease Microcytic anemia secondary to #1-improved 08/03/2022 Colon polyps-tubular adenoma removed from the rectum 05/18/2022, 2 tubular adenomas with high-grade dysplasia on the colon resection specimen 06/17/2022 Colonoscopy 09/15/2023-no polyps       Disposition: Ann [name] is in clinical remission from colon cancer.  The CEA is normal.  She will return for an office visit and CEA in 6 months.  Thornton Papas, MD  02/03/2024  12:20 PM

## 2024-02-09 ENCOUNTER — Ambulatory Visit: Payer: Medicare Other | Admitting: Internal Medicine

## 2024-02-21 ENCOUNTER — Encounter: Payer: Self-pay | Admitting: Internal Medicine

## 2024-02-21 ENCOUNTER — Ambulatory Visit: Payer: Medicare Other | Admitting: Internal Medicine

## 2024-02-21 VITALS — BP 140/80 | HR 111 | Temp 97.9°F | Ht 63.0 in | Wt 138.0 lb

## 2024-02-21 DIAGNOSIS — I10 Essential (primary) hypertension: Secondary | ICD-10-CM | POA: Diagnosis not present

## 2024-02-21 LAB — COMPREHENSIVE METABOLIC PANEL WITH GFR
ALT: 10 U/L (ref 0–35)
AST: 15 U/L (ref 0–37)
Albumin: 4.1 g/dL (ref 3.5–5.2)
Alkaline Phosphatase: 75 U/L (ref 39–117)
BUN: 25 mg/dL — ABNORMAL HIGH (ref 6–23)
CO2: 25 meq/L (ref 19–32)
Calcium: 9.4 mg/dL (ref 8.4–10.5)
Chloride: 109 meq/L (ref 96–112)
Creatinine, Ser: 1.51 mg/dL — ABNORMAL HIGH (ref 0.40–1.20)
GFR: 31.12 mL/min — ABNORMAL LOW
Glucose, Bld: 99 mg/dL (ref 70–99)
Potassium: 4.3 meq/L (ref 3.5–5.1)
Sodium: 140 meq/L (ref 135–145)
Total Bilirubin: 0.4 mg/dL (ref 0.2–1.2)
Total Protein: 7.2 g/dL (ref 6.0–8.3)

## 2024-02-21 LAB — CBC
HCT: 36.9 % (ref 36.0–46.0)
Hemoglobin: 12.2 g/dL (ref 12.0–15.0)
MCHC: 33 g/dL (ref 30.0–36.0)
MCV: 92.2 fl (ref 78.0–100.0)
Platelets: 194 10*3/uL (ref 150.0–400.0)
RBC: 4 Mil/uL (ref 3.87–5.11)
RDW: 14.1 % (ref 11.5–15.5)
WBC: 6.9 10*3/uL (ref 4.0–10.5)

## 2024-02-21 NOTE — Progress Notes (Signed)
   Subjective:   Patient ID: Ann [name], female    DOB: [birth year], 89 y.o.   MRN: [id number]  HPI The patient is an 89 YO female coming in for follow up change to BP medication. Taking losartan 100 mg daily and BP is running better overall. Denies side effects, headaches, chest pains.  Review of Systems  Constitutional: Negative.   HENT: Negative.    Eyes: Negative.   Respiratory:  Negative for cough, chest tightness and shortness of breath.   Cardiovascular:  Negative for chest pain, palpitations and leg swelling.  Gastrointestinal:  Negative for abdominal distention, abdominal pain, constipation, diarrhea, nausea and vomiting.  Musculoskeletal: Negative.   Skin: Negative.   Neurological: Negative.   Psychiatric/Behavioral: Negative.      Objective:  Physical Exam Constitutional:      Appearance: She is well-developed.  HENT:     Head: Normocephalic and atraumatic.  Cardiovascular:     Rate and Rhythm: Normal rate and regular rhythm.  Pulmonary:     Effort: Pulmonary effort is normal. No respiratory distress.     Breath sounds: Normal breath sounds. No wheezing or rales.  Abdominal:     General: Bowel sounds are normal. There is no distension.     Palpations: Abdomen is soft.     Tenderness: There is no abdominal tenderness. There is no rebound.  Musculoskeletal:     Cervical back: Normal range of motion.  Skin:    General: Skin is warm and dry.  Neurological:     Mental Status: She is alert and oriented to person, place, and time.     Coordination: Coordination normal.     Vitals:   02/21/24 1309 02/21/24 1316  BP: (!) 140/80 (!) 140/80  Pulse: (!) 111   Temp: 97.9 F (36.6 C)   TempSrc: Oral   SpO2: 99%   Weight: 138 lb (62.6 kg)   Height: 5\' 3"  (1.6 m)     Assessment & Plan:

## 2024-02-22 ENCOUNTER — Encounter: Payer: Self-pay | Admitting: Internal Medicine

## 2024-02-22 ENCOUNTER — Other Ambulatory Visit: Payer: Self-pay | Admitting: Internal Medicine

## 2024-02-22 DIAGNOSIS — I10 Essential (primary) hypertension: Secondary | ICD-10-CM

## 2024-02-24 NOTE — Assessment & Plan Note (Signed)
 BP is improved and at goal at home and close to goal here. Continue losartan 100 mg daily. Checking CMP and adjust as needed.

## 2024-04-23 ENCOUNTER — Other Ambulatory Visit

## 2024-04-24 ENCOUNTER — Encounter: Payer: Self-pay | Admitting: Internal Medicine

## 2024-04-24 ENCOUNTER — Other Ambulatory Visit (INDEPENDENT_AMBULATORY_CARE_PROVIDER_SITE_OTHER)

## 2024-04-24 DIAGNOSIS — I10 Essential (primary) hypertension: Secondary | ICD-10-CM | POA: Diagnosis not present

## 2024-04-24 LAB — COMPREHENSIVE METABOLIC PANEL WITH GFR
ALT: 9 U/L (ref 0–35)
AST: 14 U/L (ref 0–37)
Albumin: 4.1 g/dL (ref 3.5–5.2)
Alkaline Phosphatase: 56 U/L (ref 39–117)
BUN: 21 mg/dL (ref 6–23)
CO2: 23 meq/L (ref 19–32)
Calcium: 8.8 mg/dL (ref 8.4–10.5)
Chloride: 107 meq/L (ref 96–112)
Creatinine, Ser: 1.43 mg/dL — ABNORMAL HIGH (ref 0.40–1.20)
GFR: 33.18 mL/min — ABNORMAL LOW (ref >60.00)
Glucose, Bld: 90 mg/dL (ref 70–99)
Potassium: 4.4 meq/L (ref 3.5–5.1)
Sodium: 136 meq/L (ref 135–145)
Total Bilirubin: 0.6 mg/dL (ref 0.2–1.2)
Total Protein: 6.9 g/dL (ref 6.0–8.3)

## 2024-04-30 ENCOUNTER — Encounter (HOSPITAL_COMMUNITY): Payer: Self-pay

## 2024-05-07 ENCOUNTER — Ambulatory Visit

## 2024-05-07 VITALS — Ht 63.0 in | Wt 138.0 lb

## 2024-05-07 DIAGNOSIS — Z0000 Encounter for general adult medical examination without abnormal findings: Secondary | ICD-10-CM | POA: Diagnosis not present

## 2024-05-07 DIAGNOSIS — M810 Age-related osteoporosis without current pathological fracture: Secondary | ICD-10-CM | POA: Diagnosis not present

## 2024-05-07 NOTE — Progress Notes (Signed)
 Subjective:   Ann [name] is a 89 y.o. who presents for a Medicare Wellness preventive visit.  As a reminder, Annual Wellness Visits don't include a physical exam, and some assessments may be limited, especially if this visit is performed virtually. We may recommend an in-person follow-up visit with your provider if needed.  Visit Complete: Virtual I connected with  Ann [name] on 05/07/24 by a audio enabled telemedicine application and verified that I am speaking with the correct person using two identifiers.  Patient Location: Home  Provider Location: Home Office  I discussed the limitations of evaluation and management by telemedicine. The patient expressed understanding and agreed to proceed.  Vital Signs: Because this visit was a virtual/telehealth visit, some criteria may be missing or patient reported. Any vitals not documented were not able to be obtained and vitals that have been documented are patient reported.  VideoDeclined- This patient declined Librarian, academic. Therefore the visit was completed with audio only.  Persons Participating in Visit: Patient.  AWV Questionnaire: Yes: Patient Medicare AWV questionnaire was completed by the patient on 05/04/2024; I have confirmed that all information answered by patient is correct and no changes since this date.  Cardiac Risk Factors include: advanced age (>52men, >78 women);hypertension;Other (see comment), Risk factor comments: CKD     Objective:     Today's Vitals   05/07/24 0815  Weight: 138 lb (62.6 kg)  Height: 5\' 3"  (1.6 m)   Body mass index is 24.45 kg/m.     05/07/2024    8:19 AM 02/03/2024   11:51 AM 08/03/2023   11:01 AM 05/06/2023   10:45 AM 02/03/2023   11:49 AM 08/03/2022    2:03 PM 06/17/2022    6:00 PM  Advanced Directives  Does Patient Have a Medical Advance Directive? Yes Yes Yes Yes Yes No Yes  Type of Estate agent of [place];Living will  Healthcare Power of [place];Living will Healthcare Power of [place];Living will Healthcare Power of [place];Living will Healthcare Power of [place];Living will  Living will  Does patient want to make changes to medical advance directive?  No - Patient declined No - Patient declined  No - Patient declined  No - Patient declined  Copy of Healthcare Power of Attorney in Chart? No - copy requested No - copy requested No - copy requested  No - copy requested  No - copy requested  Would patient like information on creating a medical advance directive?      No - Patient declined     Current Medications (verified) Outpatient Encounter Medications as of 05/07/2024  Medication Sig   alendronate  (FOSAMAX ) 70 MG tablet TAKE 1 TABLET BY MOUTH WEEKLY  WITH 8 OZ OF PLAIN WATER 30  MINUTES BEFORE FIRST FOOD, DRINK OR MEDS. STAY UPRIGHT FOR 30  MINS   Biotin  w/ Vitamins C & E (HAIR/SKIN/NAILS PO) Take 1 tablet by mouth daily.   calcium  carbonate (OSCAL) 1500 (600 Ca) MG TABS tablet Take 600 mg of elemental calcium  by mouth daily with breakfast.   losartan  (COZAAR ) 100 MG tablet Take 1 tablet (100 mg total) by mouth daily.   No facility-administered encounter medications on file as of 05/07/2024.    Allergies (verified) Patient has no known allergies.   History: Past Medical History:  Diagnosis Date   Anemia    Cataract    Chicken pox    Colon cancer (HCC)    Hypertension    Osteoporosis    Past Surgical History:  Procedure Laterality Date   COLONOSCOPY     EYE SURGERY Bilateral    macular hole repair bil cataract bil   robotic right colectomy  06/17/2022   Family History  Problem Relation Age of Onset   Diabetes Mother    Bone cancer Mother    Arthritis Father    Uterine cancer Sister        spread to brain   Diabetes Sister    Stroke Sister    Heart disease Brother    Heart attack Brother    Colon cancer Neg Hx    Rectal cancer Neg Hx    Stomach cancer Neg Hx    Esophageal cancer  Neg Hx    Liver cancer Neg Hx    Social History   Socioeconomic History   Marital status: Widowed    Spouse name: Not on file   Number of children: 2   Years of education: Not on file   Highest education level: Master's degree (e.g., MA, MS, MEng, MEd, MSW, MBA)  Occupational History   Occupation: retired  Tobacco Use   Smoking status: Never   Smokeless tobacco: Never  Vaping Use   Vaping status: Never Used  Substance and Sexual Activity   Alcohol use: Yes    Alcohol/week: 1.0 standard drink of alcohol    Types: 1 Glasses of wine per week    Comment: social   Drug use: Never   Sexual activity: Not Currently    Partners: Male  Other Topics Concern   Not on file  Social History Narrative   [place] ancestry   Originally from [place]   Lives alone/2025   Social Drivers of Health   Financial Resource Strain: Low Risk  (05/07/2024)   Overall Financial Resource Strain (CARDIA)    Difficulty of Paying Living Expenses: Not hard at all  Food Insecurity: No Food Insecurity (05/07/2024)   Hunger Vital Sign    Worried About Running Out of Food in the Last Year: Never true    Ran Out of Food in the Last Year: Never true  Transportation Needs: No Transportation Needs (05/07/2024)   PRAPARE - Administrator, Civil Service (Medical): No    Lack of Transportation (Non-Medical): No  Physical Activity: Insufficiently Active (05/07/2024)   Exercise Vital Sign    Days of Exercise per Week: 5 days    Minutes of Exercise per Session: 20 min  Stress: No Stress Concern Present (05/07/2024)   [employer] of Occupational Health - Occupational Stress Questionnaire    Feeling of Stress : Not at all  Social Connections: Moderately Isolated (05/07/2024)   Social Connection and Isolation Panel [NHANES]    Frequency of Communication with Friends and Family: More than three times a week    Frequency of Social Gatherings with Friends and Family: Twice a week    Attends Religious  Services: More than 4 times per year    Active Member of [employer] or Organizations: No    Attends Banker Meetings: Never    Marital Status: Widowed    Tobacco Counseling Counseling given: Not Answered    Clinical Intake:     Pain : No/denies pain     BMI - recorded: 24.54 Nutritional Status: BMI of 19-24  Normal Nutritional Risks: None Diabetes: No  Lab Results  Component Value Date   HGBA1C 4.5 (L) 06/09/2022   HGBA1C 5.8 04/15/2022   HGBA1C 5.7 03/27/2021     How often do you need to  have someone help you when you read instructions, pamphlets, or other written materials from your doctor or pharmacy?: 1 - Never  Interpreter Needed?: No  Information entered by :: Ann [name], Ann [name]   Activities of Daily Living     05/04/2024   11:26 AM  In your present state of health, do you have any difficulty performing the following activities:  Hearing? 0  Vision? 0  Difficulty concentrating or making decisions? 0  Walking or climbing stairs? 0  Dressing or bathing? 0  Doing errands, shopping? 0  Preparing Food and eating ? N  Using the Toilet? N  In the past six months, have you accidently leaked urine? N  Do you have problems with loss of bowel control? N  Managing your Medications? N  Managing your Finances? N  Housekeeping or managing your Housekeeping? N    Patient Care Team: Adelia Homestead, MD as PCP - General (Internal Medicine) [facility] Albertina Hugger, MD as Consulting Physician (Gastroenterology) Candyce Champagne, MD as Consulting Physician (General Surgery) Seward Dao Alleen Arbour, MD as Consulting Physician (Ophthalmology)  Indicate any recent Medical Services you may have received from other than [facility] providers in the past year (date may be approximate).     Assessment:    This is a routine wellness examination for [facility].  Hearing/Vision screen Hearing Screening - Comments:: Denies hearing difficulties   Vision  Screening - Comments:: Wears eyeglasses/Dr. Candi Chafe   Goals Addressed               This Visit's Progress     Patient Stated (pt-stated)        Start walking more/2025       Depression Screen     05/07/2024    8:22 AM 08/09/2023   10:17 AM 07/25/2023   11:11 AM 05/06/2023   10:45 AM 10/27/2022    9:53 AM 04/16/2022    9:50 AM 04/15/2022   10:10 AM  PHQ 2/9 Scores  PHQ - 2 Score 0 0 0 0 0 0 0  PHQ- 9 Score 0 0   0  0    Fall Risk     05/04/2024   11:26 AM 02/21/2024    1:10 PM 11/09/2023    9:48 AM 08/09/2023   10:17 AM 07/25/2023   11:11 AM  Fall Risk   Falls in the past year? 0 0 0 0 0  Number falls in past yr: 0 0  0 0  Injury with Fall? 0 0 0 0 0  Risk for fall due to :     No Fall Risks  Follow up Falls evaluation completed;Falls prevention discussed Falls evaluation completed Falls evaluation completed Falls evaluation completed Falls evaluation completed    MEDICARE RISK AT HOME:  Medicare Risk at Home Any stairs in or around the home?: (Patient-Rptd) No If so, are there any without handrails?: (Patient-Rptd) No Home free of loose throw rugs in walkways, pet beds, electrical cords, etc?: (Patient-Rptd) Yes Adequate lighting in your home to reduce risk of falls?: (Patient-Rptd) Yes Life alert?: (Patient-Rptd) No Use of a cane, walker or w/c?: (Patient-Rptd) No Grab bars in the bathroom?: (Patient-Rptd) No Shower chair or bench in shower?: (Patient-Rptd) No Elevated toilet seat or a handicapped toilet?: (Patient-Rptd) No  TIMED UP AND GO:  Was the test performed?  No  Cognitive Function: Declined/Normal: No cognitive concerns noted by patient or family. Patient alert, oriented, able to answer questions appropriately and recall recent events. No  signs of memory loss or confusion.        05/06/2023   10:54 AM  6CIT Screen  What Year? 0 points  What month? 0 points  What time? 0 points  Count back from 20 0 points  Months in reverse 0 points  Repeat phrase 0  points  Total Score 0 points    Immunizations Immunization History  Administered Date(s) Administered   Moderna Covid-19 Vaccine Bivalent Booster 32yrs & up 08/20/2021   Moderna Sars-Covid-2 Vaccination 12/31/2019, 01/28/2020, 10/23/2020    Screening Tests Health Maintenance  Topic Date Due   Zoster Vaccines- Shingrix (1 of 2) Never done   Pneumonia Vaccine 90+ Years old (1 of 1 - PCV) Never done   COVID-19 Vaccine (5 - 2024-25 season) 08/21/2023   Medicare Annual Wellness (AWV)  05/05/2024   INFLUENZA VACCINE  07/20/2024   DEXA SCAN  Completed   HPV VACCINES  Aged Out   Meningococcal B Vaccine  Aged Out   DTaP/Tdap/Td  Discontinued   Colonoscopy  Discontinued    Health Maintenance  Health Maintenance Due  Topic Date Due   Zoster Vaccines- Shingrix (1 of 2) Never done   Pneumonia Vaccine 24+ Years old (1 of 1 - PCV) Never done   COVID-19 Vaccine (5 - 2024-25 season) 08/21/2023   Medicare Annual Wellness (AWV)  05/05/2024   Health Maintenance Items Addressed: DEXA ordered, See Nurse Notes  Additional Screening:  Vision Screening: Recommended annual ophthalmology exams for early detection of glaucoma and other disorders of the eye.  Dental Screening: Recommended annual dental exams for proper oral hygiene  Community Resource Referral / Chronic Care Management: CRR required this visit?  No   CCM required this visit?  No   Plan:    I have personally reviewed and noted the following in the patient's chart:   Medical and social history Use of alcohol, tobacco or illicit drugs  Current medications and supplements including opioid prescriptions. Patient is not currently taking opioid prescriptions. Functional ability and status Nutritional status Physical activity Advanced directives List of other physicians Hospitalizations, surgeries, and ER visits in previous 12 months Vitals Screenings to include cognitive, depression, and falls Referrals and  appointments  In addition, I have reviewed and discussed with patient certain preventive protocols, quality metrics, and best practice recommendations. A written personalized care plan for preventive services as well as general preventive health recommendations were provided to patient.   Ann [name], CMA   05/07/2024   After Visit Summary: (MyChart) Due to this being a telephonic visit, the after visit summary with patients personalized plan was offered to patient via MyChart   Notes: Please refer to Routing Comments.

## 2024-05-07 NOTE — Patient Instructions (Signed)
 Ann [name] , Thank you for taking time out of your busy schedule to complete your Annual Wellness Visit with me. I enjoyed our conversation and look forward to speaking with you again next year. I, as well as your care team,  appreciate your ongoing commitment to your health goals. Please review the following plan we discussed and let me know if I can assist you in the future. Your Game plan/ To Do List    Referrals: If you haven't heard from the office you've been referred to, please reach out to them at the phone provided.  You have an order for:  [x]   Bone Density     Please call for appointment:  [facility] [address] [place], [place] [address] [phone]   Make sure to wear two-piece clothing.  No lotions, powders, or deodorants the day of the appointment. Make sure to bring picture ID and insurance card.  Bring list of medications you are currently taking including any supplements.   Follow up Visits: Next Medicare AWV with our clinical staff: 05/08/2025.   Have you seen your provider in the last 6 months (3 months if uncontrolled diabetes)? Yes Next Office Visit with your provider: 08/23/2024.  Clinician Recommendations:  Aim for 30 minutes of exercise or brisk walking, 6-8 glasses of water, and 5 servings of fruits and vegetables each day. You are due for a bone density this year.  Keep up the good work.      This is a list of the screening recommended for you and due dates:  Health Maintenance  Topic Date Due   Zoster (Shingles) Vaccine (1 of 2) Never done   Pneumonia Vaccine (1 of 1 - PCV) Never done   COVID-19 Vaccine (5 - 2024-25 season) 08/21/2023   Medicare Annual Wellness Visit  05/05/2024   Flu Shot  07/20/2024   DEXA scan (bone density measurement)  Completed   HPV Vaccine  Aged Out   Meningitis B Vaccine  Aged Out   DTaP/Tdap/Td vaccine  Discontinued   Colon Cancer Screening  Discontinued    Advanced directives: (Copy  Requested) Please bring a copy of your health care power of attorney and living will to the office to be added to your chart at your convenience. You can mail to [facility] [address] [place], [place] [address] or email to [email or web address] Advance Care Planning is important because it:  [x]  Makes sure you receive the medical care that is consistent with your values, goals, and preferences  [x]  It provides guidance to your family and loved ones and reduces their decisional burden about whether or not they are making the right decisions based on your wishes.  Follow the link provided in your after visit summary or read over the paperwork we have mailed to you to help you started getting your Advance Directives in place. If you need assistance in completing these, please reach out to us  so that we can help you!  See attachments for Preventive Care and Fall Prevention Tips.

## 2024-07-09 ENCOUNTER — Other Ambulatory Visit: Payer: Self-pay | Admitting: Internal Medicine

## 2024-08-02 ENCOUNTER — Telehealth: Payer: Self-pay | Admitting: Oncology

## 2024-08-02 ENCOUNTER — Inpatient Hospital Stay: Payer: Medicare Other | Attending: Oncology | Admitting: Oncology

## 2024-08-02 ENCOUNTER — Ambulatory Visit: Payer: Self-pay | Admitting: Oncology

## 2024-08-02 ENCOUNTER — Inpatient Hospital Stay: Payer: Medicare Other

## 2024-08-02 VITALS — BP 128/74 | HR 81 | Temp 97.8°F | Resp 18 | Ht 63.0 in | Wt 139.2 lb

## 2024-08-02 DIAGNOSIS — C183 Malignant neoplasm of hepatic flexure: Secondary | ICD-10-CM

## 2024-08-02 LAB — CEA (ACCESS): CEA (CHCC): 2.88 ng/mL (ref 0.00–5.00)

## 2024-08-02 NOTE — Progress Notes (Signed)
   [facility] OFFICE PROGRESS NOTE   Diagnosis: Colon cancer  INTERVAL HISTORY:   Ms. Ann [name] returns as scheduled.  She feels well.  Good appetite and energy level.  No difficulty with bowel function.  No bleeding.  No complaint.  Objective:  Vital signs in last 24 hours:  Blood pressure 128/74, pulse 81, temperature 97.8 F (36.6 C), temperature source Temporal, resp. rate 18, height 5' 3 (1.6 m), weight 139 lb 3.2 oz (63.1 kg), SpO2 100%.    Lymphatics: No cervical, supraclavicular, axillary, or inguinal nodes Resp: Lungs clear bilaterally Cardio: Regular rate and rhythm GI: No hepatosplenomegaly, no mass, nontender Vascular: No leg edema   Lab Results:  Lab Results  Component Value Date   WBC 6.9 02/21/2024   HGB 12.2 02/21/2024   HCT 36.9 02/21/2024   MCV 92.2 02/21/2024   PLT 194.0 02/21/2024   NEUTROABS 4.3 07/25/2023    CMP  Lab Results  Component Value Date   NA 136 04/24/2024   K 4.4 04/24/2024   CL 107 04/24/2024   CO2 23 04/24/2024   GLUCOSE 90 04/24/2024   BUN 21 04/24/2024   CREATININE 1.43 (H) 04/24/2024   CALCIUM  8.8 04/24/2024   PROT 6.9 04/24/2024   ALBUMIN 4.1 04/24/2024   AST 14 04/24/2024   ALT 9 04/24/2024   ALKPHOS 56 04/24/2024   BILITOT 0.6 04/24/2024   GFRNONAA 38 (L) 06/25/2022    Lab Results  Component Value Date   CEA 2.88 08/02/2024    No results found for: INR, LABPROT  Imaging:  No results found.  Medications: I have reviewed the patient's current medications.   Assessment/Plan:  Colon cancer-hepatic flexure, stage II (T3N0), status post a robotic right colectomy 06/17/2022 Poorly differentiated adenocarcinoma, grade 3, tumor invades into pericolonic tissue, no lymphovascular perineural invasion, negative margins, 0/22 nodes, no tumor deposits, 2 tubular adenomas with high-grade dysplasia MSI-high, loss of MLH1 and PMS2, MLH1 hypermethylation present CT abdomen/pelvis 04/20/2022-wall thickening  of the hepatic flexure with adjacent stranding and luminal narrowing Colonoscopy 05/18/2022-partially obstructing mass at the transverse colon, 3 polyps in the transverse colon, one rectal polyp-transverse colon polyps not removed Mildly elevated preoperative CEA CT chest 08/11/2022-no evidence of metastatic disease Microcytic anemia secondary to #1-improved 08/03/2022 Colon polyps-tubular adenoma removed from the rectum 05/18/2022, 2 tubular adenomas with high-grade dysplasia on the colon resection specimen 06/17/2022 Colonoscopy 09/15/2023-no polyps     Disposition: Ann [name] remains in clinical remission from colon cancer.  We will follow-up on the CEA from today.  She will return for an office visit and CEA in 6 months.  Arley Hof, MD  08/02/2024  11:07 AM

## 2024-08-02 NOTE — Telephone Encounter (Signed)
 Patient has been scheduled for follow-up visit per 08/02/24 LOS.  Pt aware of scheduled appt details.

## 2024-08-03 NOTE — Telephone Encounter (Signed)
 Patient gave verbal understanding and had no further questions or concerns

## 2024-08-03 NOTE — Telephone Encounter (Signed)
-----   Message from Arley Hof sent at 08/02/2024  4:39 PM EDT ----- Please call patient, the CEA is normal, follow-up as scheduled  ----- Message ----- From: Rebecka, [facility] [place] Sent: 08/02/2024  11:04 AM EDT To: Arley KATHEE Hof, MD

## 2024-08-07 ENCOUNTER — Other Ambulatory Visit: Payer: Self-pay | Admitting: Internal Medicine

## 2024-08-07 DIAGNOSIS — I10 Essential (primary) hypertension: Secondary | ICD-10-CM

## 2024-08-23 ENCOUNTER — Ambulatory Visit: Admitting: Internal Medicine

## 2024-08-31 ENCOUNTER — Encounter: Payer: Self-pay | Admitting: Internal Medicine

## 2024-08-31 ENCOUNTER — Ambulatory Visit (INDEPENDENT_AMBULATORY_CARE_PROVIDER_SITE_OTHER): Admitting: Internal Medicine

## 2024-08-31 VITALS — BP 122/80 | HR 85 | Temp 98.2°F | Ht 63.0 in | Wt 138.0 lb

## 2024-08-31 DIAGNOSIS — D500 Iron deficiency anemia secondary to blood loss (chronic): Secondary | ICD-10-CM

## 2024-08-31 DIAGNOSIS — I10 Essential (primary) hypertension: Secondary | ICD-10-CM | POA: Diagnosis not present

## 2024-08-31 DIAGNOSIS — N1832 Chronic kidney disease, stage 3b: Secondary | ICD-10-CM

## 2024-08-31 LAB — COMPREHENSIVE METABOLIC PANEL WITH GFR
ALT: 10 U/L (ref 0–35)
AST: 17 U/L (ref 0–37)
Albumin: 4.3 g/dL (ref 3.5–5.2)
Alkaline Phosphatase: 70 U/L (ref 39–117)
BUN: 32 mg/dL — ABNORMAL HIGH (ref 6–23)
CO2: 22 meq/L (ref 19–32)
Calcium: 9.5 mg/dL (ref 8.4–10.5)
Chloride: 108 meq/L (ref 96–112)
Creatinine, Ser: 1.69 mg/dL — ABNORMAL HIGH (ref 0.40–1.20)
GFR: 27.08 mL/min — ABNORMAL LOW (ref >60.00)
Glucose, Bld: 108 mg/dL — ABNORMAL HIGH (ref 70–99)
Potassium: 5 meq/L (ref 3.5–5.1)
Sodium: 137 meq/L (ref 135–145)
Total Bilirubin: 0.5 mg/dL (ref 0.2–1.2)
Total Protein: 7.6 g/dL (ref 6.0–8.3)

## 2024-08-31 LAB — CBC
HCT: 37.3 % (ref 36.0–46.0)
Hemoglobin: 12.3 g/dL (ref 12.0–15.0)
MCHC: 33 g/dL (ref 30.0–36.0)
MCV: 90.7 fl (ref 78.0–100.0)
Platelets: 255 K/uL (ref 150.0–400.0)
RBC: 4.11 Mil/uL (ref 3.87–5.11)
RDW: 13.7 % (ref 11.5–15.5)
WBC: 6.6 K/uL (ref 4.0–10.5)

## 2024-08-31 LAB — LIPID PANEL
Cholesterol: 195 mg/dL (ref 0–200)
HDL: 65.6 mg/dL (ref >39.00)
LDL Cholesterol: 109 mg/dL — ABNORMAL HIGH (ref 0–99)
NonHDL: 129.74
Total CHOL/HDL Ratio: 3
Triglycerides: 106 mg/dL (ref 0.0–149.0)
VLDL: 21.2 mg/dL (ref 0.0–40.0)

## 2024-08-31 NOTE — Assessment & Plan Note (Signed)
 Primary hypertension is managed with Losartan , which provides effective blood pressure control. Continue Losartan  100 mg orally daily for blood pressure management.

## 2024-08-31 NOTE — Assessment & Plan Note (Signed)
Checking CBC for stability. No clinical signs of bleeding.

## 2024-08-31 NOTE — Patient Instructions (Signed)
 We will check the labs today.

## 2024-08-31 NOTE — Assessment & Plan Note (Signed)
 Chronic kidney disease stage 3b with stable GFR around 35 for the past three to four years. Losartan  is used for kidney protection. No nephrology referral is needed as kidney function remains stable. Monitor kidney function with blood tests twice a year. Continue Losartan  100 mg orally daily for blood pressure control and kidney protection.

## 2024-08-31 NOTE — Progress Notes (Signed)
   Subjective:   Patient ID: Ann [name], female    DOB: Apr 21, 1935, 89 y.o.   MRN: [id number]  Discussed the use of AI scribe software for clinical note transcription with the patient, who gave verbal consent to proceed.  History of Present Illness Ann [name] is an 89 year old female with chronic kidney disease who presents for routine follow-up.  She has chronic kidney disease with a stable estimated glomerular filtration rate (eGFR) of approximately 35 mL/min/1.73 m for the past three to four years. Creatinine levels are monitored regularly. No symptoms such as difficulty urinating or fluid retention. She is currently taking losartan .  No new symptoms such as chest pain, breathing difficulties, or joint pain.  She has not yet scheduled a bone density test due to Medicare coverage timing but plans to do so in November. She is currently on a generic form of Fosamax  and has not experienced any fractures.  Her cancer has not recurred, and she had a follow-up with her oncologist in February.  Review of Systems  Constitutional: Negative.   HENT: Negative.    Eyes: Negative.   Respiratory:  Negative for cough, chest tightness and shortness of breath.   Cardiovascular:  Negative for chest pain, palpitations and leg swelling.  Gastrointestinal:  Negative for abdominal distention, abdominal pain, constipation, diarrhea, nausea and vomiting.  Musculoskeletal: Negative.   Skin: Negative.   Neurological: Negative.   Psychiatric/Behavioral: Negative.      Objective:  Physical Exam Constitutional:      Appearance: She is well-developed.  HENT:     Head: Normocephalic and atraumatic.  Cardiovascular:     Rate and Rhythm: Normal rate and regular rhythm.  Pulmonary:     Effort: Pulmonary effort is normal. No respiratory distress.     Breath sounds: Normal breath sounds. No wheezing or rales.  Abdominal:     General: Bowel sounds are normal. There is no distension.     Palpations:  Abdomen is soft.     Tenderness: There is no abdominal tenderness.  Musculoskeletal:     Cervical back: Normal range of motion.  Skin:    General: Skin is warm and dry.  Neurological:     Mental Status: She is alert and oriented to person, place, and time.     Coordination: Coordination normal.     Vitals:   08/31/24 1023  BP: 122/80  Pulse: 85  Temp: 98.2 F (36.8 C)  TempSrc: Oral  SpO2: 98%  Weight: 138 lb (62.6 kg)  Height: 5' 3 (1.6 m)    Assessment and Plan Assessment & Plan Chronic kidney disease stage 3b   Chronic kidney disease stage 3b with stable GFR around 35 for the past three to four years. Losartan  is used for kidney protection. No nephrology referral is needed as kidney function remains stable. Monitor kidney function with blood tests twice a year. Continue Losartan  100 mg orally daily for blood pressure control and kidney protection.  Primary hypertension   Primary hypertension is managed with Losartan , which provides effective blood pressure control. Continue Losartan  100 mg orally daily for blood pressure management.

## 2024-09-03 ENCOUNTER — Telehealth: Payer: Self-pay | Admitting: Radiology

## 2024-09-03 ENCOUNTER — Ambulatory Visit: Payer: Self-pay | Admitting: Internal Medicine

## 2024-09-03 NOTE — Progress Notes (Signed)
 Called and LVM. 1st attempt.

## 2024-09-03 NOTE — Telephone Encounter (Signed)
 Copied from CRM [id number]. Topic: Clinical - Lab/Test Results >> Sep 03, 2024  1:22 PM Ann [name] wrote: Reason for CRM: Pt was made aware of recent labs results from [facility] and stated that she is willing to see a kidney specialist .

## 2024-09-03 NOTE — Telephone Encounter (Signed)
 I have noted this back in result note and sen this over to the provider

## 2024-09-04 ENCOUNTER — Other Ambulatory Visit: Payer: Self-pay | Admitting: Internal Medicine

## 2024-09-04 DIAGNOSIS — N1832 Chronic kidney disease, stage 3b: Secondary | ICD-10-CM

## 2024-09-26 ENCOUNTER — Encounter: Payer: Self-pay | Admitting: Internal Medicine

## 2024-10-08 ENCOUNTER — Telehealth: Payer: Self-pay

## 2024-10-08 NOTE — Telephone Encounter (Signed)
 Copied from CRM #[id number]. Topic: Appointments - Scheduling Inquiry for Clinic >> Oct 08, 2024  1:30 PM Ann [name] wrote: Reason for CRM: Patient is wanting to schedule an appointment for bone density test.

## 2024-10-17 ENCOUNTER — Other Ambulatory Visit: Payer: Self-pay | Admitting: Internal Medicine

## 2024-10-17 DIAGNOSIS — N179 Acute kidney failure, unspecified: Secondary | ICD-10-CM

## 2024-10-18 LAB — LAB REPORT - SCANNED: PTH, Intact: 69

## 2024-10-24 ENCOUNTER — Ambulatory Visit
Admission: RE | Admit: 2024-10-24 | Discharge: 2024-10-24 | Disposition: A | Source: Ambulatory Visit | Attending: Internal Medicine | Admitting: Internal Medicine

## 2024-10-24 DIAGNOSIS — N179 Acute kidney failure, unspecified: Secondary | ICD-10-CM

## 2024-10-29 ENCOUNTER — Ambulatory Visit
Admission: RE | Admit: 2024-10-29 | Discharge: 2024-10-29 | Disposition: A | Source: Ambulatory Visit | Attending: Internal Medicine | Admitting: Internal Medicine

## 2024-10-29 DIAGNOSIS — M810 Age-related osteoporosis without current pathological fracture: Secondary | ICD-10-CM | POA: Diagnosis not present

## 2024-10-31 ENCOUNTER — Ambulatory Visit: Payer: Self-pay | Admitting: Internal Medicine

## 2024-12-30 ENCOUNTER — Other Ambulatory Visit: Payer: Self-pay | Admitting: Internal Medicine

## 2024-12-30 DIAGNOSIS — I10 Essential (primary) hypertension: Secondary | ICD-10-CM

## 2025-01-31 ENCOUNTER — Ambulatory Visit: Admitting: Oncology

## 2025-01-31 ENCOUNTER — Other Ambulatory Visit

## 2025-03-01 ENCOUNTER — Ambulatory Visit: Admitting: Internal Medicine

## 2025-05-08 ENCOUNTER — Ambulatory Visit
